# Patient Record
Sex: Male | Born: 1955 | Race: White | Hispanic: No | Marital: Married | Smoking: Current some day smoker
Health system: Southern US, Community
[De-identification: ages and names within clinical notes are randomized; demographics above are authoritative.]

## PROBLEM LIST (undated history)

## (undated) DIAGNOSIS — K219 Gastro-esophageal reflux disease without esophagitis: Secondary | ICD-10-CM

## (undated) DIAGNOSIS — Z789 Other specified health status: Secondary | ICD-10-CM

## (undated) DIAGNOSIS — Z8711 Personal history of peptic ulcer disease: Secondary | ICD-10-CM

## (undated) DIAGNOSIS — Z8719 Personal history of other diseases of the digestive system: Secondary | ICD-10-CM

## (undated) HISTORY — PX: COLONOSCOPY: SHX174

---

## 2015-12-16 DIAGNOSIS — J301 Allergic rhinitis due to pollen: Secondary | ICD-10-CM | POA: Insufficient documentation

## 2015-12-16 DIAGNOSIS — K219 Gastro-esophageal reflux disease without esophagitis: Secondary | ICD-10-CM | POA: Insufficient documentation

## 2016-01-01 DIAGNOSIS — R0602 Shortness of breath: Secondary | ICD-10-CM | POA: Insufficient documentation

## 2016-04-16 ENCOUNTER — Encounter: Payer: Self-pay | Admitting: Emergency Medicine

## 2016-04-16 DIAGNOSIS — K047 Periapical abscess without sinus: Secondary | ICD-10-CM | POA: Insufficient documentation

## 2016-04-16 DIAGNOSIS — F17200 Nicotine dependence, unspecified, uncomplicated: Secondary | ICD-10-CM | POA: Diagnosis not present

## 2016-04-16 DIAGNOSIS — R600 Localized edema: Secondary | ICD-10-CM | POA: Diagnosis present

## 2016-04-16 LAB — CBC WITH DIFFERENTIAL/PLATELET
BASOS ABS: 0.1 10*3/uL (ref 0–0.1)
Basophils Relative: 1 %
Eosinophils Absolute: 0.1 10*3/uL (ref 0–0.7)
Eosinophils Relative: 1 %
HEMATOCRIT: 34.8 % — AB (ref 40.0–52.0)
HEMOGLOBIN: 12.3 g/dL — AB (ref 13.0–18.0)
LYMPHS PCT: 15 %
Lymphs Abs: 1.7 10*3/uL (ref 1.0–3.6)
MCH: 32.8 pg (ref 26.0–34.0)
MCHC: 35.3 g/dL (ref 32.0–36.0)
MCV: 92.9 fL (ref 80.0–100.0)
MONO ABS: 1.4 10*3/uL — AB (ref 0.2–1.0)
Monocytes Relative: 13 %
NEUTROS ABS: 7.8 10*3/uL — AB (ref 1.4–6.5)
Neutrophils Relative %: 70 %
Platelets: 392 10*3/uL (ref 150–440)
RBC: 3.75 MIL/uL — AB (ref 4.40–5.90)
RDW: 13.8 % (ref 11.5–14.5)
WBC: 11.1 10*3/uL — ABNORMAL HIGH (ref 3.8–10.6)

## 2016-04-16 LAB — COMPREHENSIVE METABOLIC PANEL
ALK PHOS: 64 U/L (ref 38–126)
ALT: 26 U/L (ref 17–63)
AST: 24 U/L (ref 15–41)
Albumin: 3.8 g/dL (ref 3.5–5.0)
Anion gap: 7 (ref 5–15)
BILIRUBIN TOTAL: 0.7 mg/dL (ref 0.3–1.2)
BUN: 6 mg/dL (ref 6–20)
CALCIUM: 8.8 mg/dL — AB (ref 8.9–10.3)
CO2: 27 mmol/L (ref 22–32)
CREATININE: 0.9 mg/dL (ref 0.61–1.24)
Chloride: 97 mmol/L — ABNORMAL LOW (ref 101–111)
GFR calc Af Amer: >60 mL/min (ref >60)
Glucose, Bld: 114 mg/dL — ABNORMAL HIGH (ref 65–99)
Potassium: 3.8 mmol/L (ref 3.5–5.1)
Sodium: 131 mmol/L — ABNORMAL LOW (ref 135–145)
TOTAL PROTEIN: 6.9 g/dL (ref 6.5–8.1)

## 2016-04-16 NOTE — ED Triage Notes (Signed)
Pt with left sided upper jaw swelling extending to left eye for 3 days. Pt states has worsened this am. Pt denies known fever. Pt denies visual acuity changes. Pt with moderate amount of swelling noted to left cheek.

## 2016-04-16 NOTE — ED Triage Notes (Signed)
Pt states has been on zithromax for 24 hours for infection.

## 2016-04-17 ENCOUNTER — Emergency Department
Admission: EM | Admit: 2016-04-17 | Discharge: 2016-04-17 | Disposition: A | Discharge status: Home / Self Care | Payer: PRIVATE HEALTH INSURANCE | Attending: Emergency Medicine | Admitting: Emergency Medicine

## 2016-04-17 DIAGNOSIS — K047 Periapical abscess without sinus: Secondary | ICD-10-CM

## 2016-04-17 MED ORDER — HYDROMORPHONE HCL 1 MG/ML IJ SOLN
1.0000 mg | Freq: Once | INTRAMUSCULAR | Status: AC
  Administered 2016-04-17: 1 mg via INTRAVENOUS
  Filled 2016-04-17: qty 1

## 2016-04-17 MED ORDER — HYDROMORPHONE HCL 2 MG PO TABS: 2.0000 mg | ORAL_TABLET | Freq: Two times a day (BID) | ORAL | 0 refills | Status: DC | PRN

## 2016-04-17 MED ORDER — CLINDAMYCIN HCL 300 MG PO CAPS: 300.0000 mg | ORAL_CAPSULE | Freq: Three times a day (TID) | ORAL | 0 refills | Status: AC

## 2016-04-17 MED ORDER — CLINDAMYCIN PHOSPHATE 900 MG/50ML IV SOLN
900.0000 mg | Freq: Once | INTRAVENOUS | Status: AC
  Administered 2016-04-17: 900 mg via INTRAVENOUS
  Filled 2016-04-17: qty 50

## 2016-04-17 MED ORDER — ONDANSETRON HCL 4 MG/2ML IJ SOLN
4.0000 mg | Freq: Once | INTRAMUSCULAR | Status: AC
  Administered 2016-04-17: 4 mg via INTRAVENOUS
  Filled 2016-04-17: qty 2

## 2016-04-17 NOTE — ED Notes (Signed)
Pt states he has dental abscess he was tx for yesterday and was given tramadol for pain and a zpac for abx.  Pt comes into ED tonight in pain and states his pain is uncontrolled with the medication he was given.  He states he has taken his meds as prescribed.

## 2016-04-17 NOTE — ED Provider Notes (Signed)
Time Seen: Approximately 12:41 AM  I have reviewed the triage notes  Chief Complaint: Facial Swelling   History of Present Illness: Jacob Byrd is a 60 y.o. male *who presents after being evaluated by a dentist for what appears to be a dental infection. Patient's had some left-sided facial and jaw swelling that she swelled up to the left side of his face over the last 3 days. Patient states that he's had no fever at home and is been able to eat and drink though uncomfortable. The patient was placed on a Zithromax for a dental infection due to his allergy for penicillin. History reviewed. No pertinent past medical history.  There are no active problems to display for this patient.   History reviewed. No pertinent surgical history.  History reviewed. No pertinent surgical history.  Current Outpatient Rx  . Order #: [id number] Class: Print  . Order #: [id number] Class: Print    Allergies:  Penicillins  Family History: No family history on file.  Social History: Social History  Substance Use Topics  . Smoking status: Current Some Day Smoker  . Smokeless tobacco: Never Used  . Alcohol use Yes     Review of Systems:   10 point review of systems was performed and was otherwise negative:  Constitutional: No fever Eyes: No visual disturbances ENT: No sore throat, ear pain Cardiac: No chest pain Respiratory: No shortness of breath, wheezing, or stridor Abdomen: No abdominal pain, no vomiting, No diarrhea Endocrine: No weight loss, No night sweats Extremities: No peripheral edema, cyanosis Skin: No rashes, easy bruising Neurologic: No focal weakness, trouble with speech or swollowing Urologic: No dysuria, Hematuria, or urinary frequency   Physical Exam:  ED Triage Vitals  Enc Vitals Group     BP 04/16/16 2145 (!) 149/84     Pulse Rate 04/16/16 2145 98     Resp 04/16/16 2145 16     Temp 04/16/16 2145 99.2 F (37.3 C)     Temp Source 04/16/16 2145 Oral     SpO2  04/16/16 2145 100 %     Weight 04/16/16 2147 198 lb (89.8 kg)     Height 04/16/16 2147 5\' 10"  (1.778 m)     Head Circumference --      Peak Flow --      Pain Score 04/16/16 2148 6     Pain Loc --      Pain Edu? --      Excl. in GC? --     General: Awake , Alert , and Oriented times 3; GCS 15 Head: Patient has swelling in the left side of his face with tenderness there is no fluctuance. No trismus. Eyes: Pupils equal , round, reactive to light Nose/Throat: No nasal drainage, patent upper airway without erythema or exudate. Oral exam shows dental abscess left upper incisor region  Neck: Supple, Full range of motion, No anterior adenopathy or palpable thyroid masses no induration or swelling  Lungs: Clear to ascultation without wheezes , rhonchi, or rales Heart: Regular rate, regular rhythm without murmurs , gallops , or rubs.        Extremities: 2 plus symmetric pulses. No edema, clubbing or cyanosis Neurologic: normal ambulation, Motor symmetric without deficits, sensory intact Skin: warm, dry, no rashes   Labs:   All laboratory work was reviewed including any pertinent negatives or positives listed below:  Labs Reviewed  CBC WITH DIFFERENTIAL/PLATELET - Abnormal; Notable for the following:       Result Value   WBC  11.1 (*)    RBC 3.75 (*)    Hemoglobin 12.3 (*)    HCT 34.8 (*)    Neutro Abs 7.8 (*)    Monocytes Absolute 1.4 (*)    All other components within normal limits  COMPREHENSIVE METABOLIC PANEL - Abnormal; Notable for the following:    Sodium 131 (*)    Chloride 97 (*)    Glucose, Bld 114 (*)    Calcium 8.8 (*)    All other components within normal limits     ED Course:  I felt at this time imaging was not necessary. He appears to have a patent upper airway and full range of motion of his jaw. Patient obviously has some extension of the abscess in the left maxillary region. The patient was given IV clindamycin here in emergency department him be discharged on  oral clindamycin was wise to stop the Zithromax. He was advised to continue with The over-the-counter pain medication and was advised to return here if he develops trismus, fever, or any other new concerns Clinical Course     Assessment:  Dental abscess with facial swelling  Final Clinical Impression:   Final diagnoses:  Dental abscess     Plan:  Outpatient " New Prescriptions   CLINDAMYCIN (CLEOCIN) 300 MG CAPSULE    Take 1 capsule (300 mg total) by mouth 3 (three) times daily.   HYDROMORPHONE (DILAUDID) 2 MG TABLET    Take 1 tablet (2 mg total) by mouth every 12 (twelve) hours as needed for severe pain.  " Patient was advised to return immediately if condition worsens. Patient was advised to follow up with their primary care physician or other specialized physicians involved in their outpatient care. The patient and/or family member/power of attorney had laboratory results reviewed at the bedside. All questions and concerns were addressed and appropriate discharge instructions were distributed by the nursing staff.             Jennye MoccasinBrian S Harding Thomure, MD 04/17/16 [phone]

## 2016-04-17 NOTE — ED Notes (Signed)
Pt discharged to home.  Family member driving.  Discharge instructions reviewed.  Verbalized understanding.  No questions or concerns at this time.  Teach back verified.  Pt in NAD.  No items left in ED.   

## 2017-08-01 ENCOUNTER — Encounter: Payer: Self-pay | Admitting: *Deleted

## 2017-08-05 ENCOUNTER — Other Ambulatory Visit: Payer: Self-pay | Admitting: Family Medicine

## 2017-08-05 DIAGNOSIS — N5089 Other specified disorders of the male genital organs: Secondary | ICD-10-CM

## 2017-08-08 ENCOUNTER — Ambulatory Visit
Admission: RE | Admit: 2017-08-08 | Discharge: 2017-08-08 | Disposition: A | Discharge status: Home / Self Care | Payer: PRIVATE HEALTH INSURANCE | Source: Ambulatory Visit | Attending: Family Medicine | Admitting: Family Medicine

## 2017-08-08 DIAGNOSIS — N509 Disorder of male genital organs, unspecified: Secondary | ICD-10-CM | POA: Insufficient documentation

## 2017-08-08 DIAGNOSIS — N5089 Other specified disorders of the male genital organs: Secondary | ICD-10-CM

## 2017-08-08 DIAGNOSIS — I861 Scrotal varices: Secondary | ICD-10-CM | POA: Diagnosis not present

## 2017-08-08 DIAGNOSIS — N433 Hydrocele, unspecified: Secondary | ICD-10-CM | POA: Insufficient documentation

## 2017-08-08 DIAGNOSIS — N503 Cyst of epididymis: Secondary | ICD-10-CM | POA: Insufficient documentation

## 2017-08-11 ENCOUNTER — Other Ambulatory Visit: Payer: Self-pay

## 2017-08-11 ENCOUNTER — Ambulatory Visit
Admission: RE | Admit: 2017-08-11 | Discharge: 2017-08-11 | Disposition: A | Discharge status: Home / Self Care | Payer: PRIVATE HEALTH INSURANCE | Source: Ambulatory Visit | Attending: Ophthalmology | Admitting: Ophthalmology

## 2017-08-11 ENCOUNTER — Ambulatory Visit: Payer: PRIVATE HEALTH INSURANCE | Admitting: Anesthesiology

## 2017-08-11 ENCOUNTER — Encounter
Admission: RE | Disposition: A | Discharge status: Home / Self Care | Payer: Self-pay | Source: Ambulatory Visit | Attending: Ophthalmology

## 2017-08-11 ENCOUNTER — Encounter: Payer: Self-pay | Admitting: *Deleted

## 2017-08-11 DIAGNOSIS — H2512 Age-related nuclear cataract, left eye: Secondary | ICD-10-CM | POA: Diagnosis present

## 2017-08-11 DIAGNOSIS — Z79899 Other long term (current) drug therapy: Secondary | ICD-10-CM | POA: Insufficient documentation

## 2017-08-11 DIAGNOSIS — F17200 Nicotine dependence, unspecified, uncomplicated: Secondary | ICD-10-CM | POA: Diagnosis not present

## 2017-08-11 DIAGNOSIS — Z880 Allergy status to penicillin: Secondary | ICD-10-CM | POA: Diagnosis not present

## 2017-08-11 DIAGNOSIS — Z888 Allergy status to other drugs, medicaments and biological substances status: Secondary | ICD-10-CM | POA: Diagnosis not present

## 2017-08-11 DIAGNOSIS — K219 Gastro-esophageal reflux disease without esophagitis: Secondary | ICD-10-CM | POA: Insufficient documentation

## 2017-08-11 HISTORY — DX: Other specified health status: Z78.9

## 2017-08-11 HISTORY — PX: CATARACT EXTRACTION W/PHACO: SHX586

## 2017-08-11 HISTORY — DX: Personal history of other diseases of the digestive system: Z87.19

## 2017-08-11 HISTORY — DX: Personal history of peptic ulcer disease: Z87.11

## 2017-08-11 HISTORY — DX: Gastro-esophageal reflux disease without esophagitis: K21.9

## 2017-08-11 SURGERY — PHACOEMULSIFICATION, CATARACT, WITH IOL INSERTION
Anesthesia: Monitor Anesthesia Care | Site: Eye | Laterality: Left | Wound class: Clean

## 2017-08-11 MED ORDER — MOXIFLOXACIN HCL 0.5 % OP SOLN: 1.0000 [drp] | OPHTHALMIC | Status: DC | PRN

## 2017-08-11 MED ORDER — SODIUM HYALURONATE 23 MG/ML IO SOLN
INTRAOCULAR | Status: DC | PRN
  Administered 2017-08-11: 0.6 mL via INTRAOCULAR

## 2017-08-11 MED ORDER — BSS IO SOLN
INTRAOCULAR | Status: DC | PRN
  Administered 2017-08-11: 1 via INTRAOCULAR

## 2017-08-11 MED ORDER — ARMC OPHTHALMIC DILATING DROPS
1.0000 "application " | OPHTHALMIC | Status: AC
  Administered 2017-08-11 (×3): 1 via OPHTHALMIC

## 2017-08-11 MED ORDER — EPINEPHRINE PF 1 MG/ML IJ SOLN
INTRAMUSCULAR | Status: AC
  Filled 2017-08-11: qty 1

## 2017-08-11 MED ORDER — MOXIFLOXACIN HCL 0.5 % OP SOLN
OPHTHALMIC | Status: DC | PRN
  Administered 2017-08-11: 0.2 mL via OPHTHALMIC

## 2017-08-11 MED ORDER — MOXIFLOXACIN HCL 0.5 % OP SOLN
OPHTHALMIC | Status: AC
  Filled 2017-08-11: qty 3

## 2017-08-11 MED ORDER — SODIUM HYALURONATE 23 MG/ML IO SOLN
INTRAOCULAR | Status: AC
  Filled 2017-08-11: qty 0.6

## 2017-08-11 MED ORDER — FENTANYL CITRATE (PF) 100 MCG/2ML IJ SOLN
INTRAMUSCULAR | Status: AC
  Filled 2017-08-11: qty 2

## 2017-08-11 MED ORDER — FENTANYL CITRATE (PF) 100 MCG/2ML IJ SOLN
INTRAMUSCULAR | Status: DC | PRN
  Administered 2017-08-11: 25 ug via INTRAVENOUS
  Administered 2017-08-11: 75 ug via INTRAVENOUS

## 2017-08-11 MED ORDER — POVIDONE-IODINE 5 % OP SOLN
OPHTHALMIC | Status: AC
  Filled 2017-08-11: qty 30

## 2017-08-11 MED ORDER — SODIUM CHLORIDE 0.9 % IV SOLN
INTRAVENOUS | Status: DC
  Administered 2017-08-11: 50 mL/h via INTRAVENOUS

## 2017-08-11 MED ORDER — LIDOCAINE HCL (PF) 4 % IJ SOLN
INTRAMUSCULAR | Status: AC
  Filled 2017-08-11: qty 5

## 2017-08-11 MED ORDER — LIDOCAINE HCL (PF) 4 % IJ SOLN
INTRAOCULAR | Status: DC | PRN
  Administered 2017-08-11: 2 mL via OPHTHALMIC

## 2017-08-11 MED ORDER — SODIUM HYALURONATE 10 MG/ML IO SOLN
INTRAOCULAR | Status: DC | PRN
  Administered 2017-08-11: 0.55 mL via INTRAOCULAR

## 2017-08-11 MED ORDER — ARMC OPHTHALMIC DILATING DROPS
OPHTHALMIC | Status: AC
  Administered 2017-08-11: 1 via OPHTHALMIC
  Filled 2017-08-11: qty 0.4

## 2017-08-11 MED ORDER — POVIDONE-IODINE 5 % OP SOLN
OPHTHALMIC | Status: DC | PRN
  Administered 2017-08-11: 1 via OPHTHALMIC

## 2017-08-11 SURGICAL SUPPLY — 16 items
DISSECTOR HYDRO NUCLEUS 50X22 (MISCELLANEOUS) ×3 IMPLANT
GLOVE BIO SURGEON STRL SZ8 (GLOVE) ×3 IMPLANT
GLOVE BIOGEL M 6.5 STRL (GLOVE) ×3 IMPLANT
GLOVE SURG LX 7.5 STRW (GLOVE) ×2
GLOVE SURG LX STRL 7.5 STRW (GLOVE) ×1 IMPLANT
GOWN STRL REUS W/ TWL LRG LVL3 (GOWN DISPOSABLE) ×2 IMPLANT
GOWN STRL REUS W/TWL LRG LVL3 (GOWN DISPOSABLE) ×4
LABEL CATARACT MEDS ST (LABEL) ×3 IMPLANT
LENS IOL TECNIS ITEC 20.5 (Intraocular Lens) ×3 IMPLANT
PACK CATARACT (MISCELLANEOUS) ×3 IMPLANT
PACK CATARACT KING (MISCELLANEOUS) ×3 IMPLANT
PACK EYE AFTER SURG (MISCELLANEOUS) ×3 IMPLANT
SOL BSS BAG (MISCELLANEOUS) ×3
SOLUTION BSS BAG (MISCELLANEOUS) ×1 IMPLANT
WATER STERILE IRR 250ML POUR (IV SOLUTION) ×3 IMPLANT
WIPE NON LINTING 3.25X3.25 (MISCELLANEOUS) ×3 IMPLANT

## 2017-08-11 NOTE — Discharge Instructions (Signed)
Eye Surgery Discharge Instructions  Expect mild scratchy sensation or mild soreness. DO NOT RUB YOUR EYE!  The day of surgery:  Minimal physical activity, but bed rest is not required  No reading, computer work, or close hand work  No bending, lifting, or straining.  May watch TV  For 24 hours:  No driving, legal decisions, or alcoholic beverages  Safety precautions  Eat anything you prefer: It is better to start with liquids, then soup then solid foods.  _____ Eye patch should be worn until postoperative exam tomorrow.  ____ Solar shield eyeglasses should be worn for comfort in the sunlight/patch while sleeping  Resume all regular medications including aspirin or Coumadin if these were discontinued prior to surgery. You may shower, bathe, shave, or wash your hair. Tylenol may be taken for mild discomfort.  Call your doctor if you experience significant pain, nausea, or vomiting, fever > 101 or other signs of infection. [phone] or [phone] Specific instructions:  Follow-up Information    [place] CraneKing, Bradley Mark, MD Follow up on 08/12/2017.   Specialty:  Ophthalmology Why:  9:30 Contact information: [address] [place] [address] [phone]

## 2017-08-11 NOTE — Anesthesia Procedure Notes (Signed)
Procedure Name: MAC Performed by: Enrrique Mierzwa, CRNA Pre-anesthesia Checklist: Patient identified, Emergency Drugs available, Suction available, Patient being monitored and Timeout performed Oxygen Delivery Method: Nasal cannula       

## 2017-08-11 NOTE — Anesthesia Post-op Follow-up Note (Signed)
Anesthesia QCDR form completed.        

## 2017-08-11 NOTE — H&P (Signed)
The History and Physical notes are on paper, have been signed, and are to be scanned.   I have examined the patient and there are no changes to the H&P.   Jacob BladeBradley Byrd 08/11/2017 8:31 AM

## 2017-08-11 NOTE — Anesthesia Postprocedure Evaluation (Signed)
Anesthesia Post Note  Patient: Jacob Byrd  Procedure(s) Performed: CATARACT EXTRACTION PHACO AND INTRAOCULAR LENS PLACEMENT ([place]) (Left Eye)  Patient location during evaluation: PACU Anesthesia Type: MAC Level of consciousness: awake and alert and oriented Pain management: pain level controlled Vital Signs Assessment: post-procedure vital signs reviewed and stable Respiratory status: spontaneous breathing, nonlabored ventilation and respiratory function stable Cardiovascular status: blood pressure returned to baseline and stable Postop Assessment: no signs of nausea or vomiting Anesthetic complications: no     Last Vitals:  Vitals:   08/11/17 0718 08/11/17 0908  BP: 112/67 (!) 99/57  Pulse: 67   Resp: 17 12  Temp: 36.6 C 37.1 C  SpO2: 97% 100%    Last Pain:  Vitals:   08/11/17 0718  TempSrc: Tympanic                 Peggie Hornak

## 2017-08-11 NOTE — Transfer of Care (Signed)
Immediate Anesthesia Transfer of Care Note  Patient: Jacob Byrd  Procedure(s) Performed: CATARACT EXTRACTION PHACO AND INTRAOCULAR LENS PLACEMENT ([facility]) (Left Eye)  Patient Location: PACU  Anesthesia Type:MAC  Level of Consciousness: awake, alert  and oriented  Airway & Oxygen Therapy: Patient Spontanous Breathing  Post-op Assessment: Report given to RN and Post -op Vital signs reviewed and stable  Post vital signs: Reviewed and stable  Last Vitals:  Vitals:   08/11/17 0718 08/11/17 0908  BP: 112/67 (!) 99/57  Pulse: 67   Resp: 17 12  Temp: 36.6 C   SpO2: 97% 100%    Last Pain:  Vitals:   08/11/17 0718  TempSrc: Tympanic         Complications: No apparent anesthesia complications

## 2017-08-11 NOTE — Anesthesia Postprocedure Evaluation (Signed)
Anesthesia Post Note  Patient: Jacob Byrd  Procedure(s) Performed: CATARACT EXTRACTION PHACO AND INTRAOCULAR LENS PLACEMENT ([place]) (Left Eye)  Patient location during evaluation: PACU Anesthesia Type: MAC Level of consciousness: awake, awake and alert and oriented Pain management: pain level controlled Vital Signs Assessment: post-procedure vital signs reviewed and stable Respiratory status: spontaneous breathing, nonlabored ventilation and respiratory function stable Cardiovascular status: stable Anesthetic complications: no     Last Vitals:  Vitals:   08/11/17 0718 08/11/17 0908  BP: 112/67 (!) 99/57  Pulse: 67   Resp: 17 12  Temp: 36.6 C   SpO2: 97% 100%    Last Pain:  Vitals:   08/11/17 0718  TempSrc: Tympanic                 [name]

## 2017-08-11 NOTE — Op Note (Signed)
OPERATIVE NOTE  Jacob LentoJames Pultz [id number] 08/11/2017   PREOPERATIVE DIAGNOSIS:  Nuclear sclerotic cataract left eye.  H25.12   POSTOPERATIVE DIAGNOSIS:    Nuclear sclerotic cataract left eye.     PROCEDURE:  Phacoemusification with posterior chamber intraocular lens placement of the left eye   LENS:   Implant Name Type Inv. Item Serial No. Manufacturer Lot No. LRB No. Used  LENS IOL DIOP 20.5 - [id number] Intraocular Lens LENS IOL DIOP 20.5 [id number] AMO  Left 1       PCB00 +20.5   ULTRASOUND TIME: 0 minutes 31.4 seconds.  CDE 4.86   SURGEON:  Willey BladeBradley King, MD, MPH   ANESTHESIA:  Topical with tetracaine drops augmented with 1% preservative-free intracameral lidocaine.  ESTIMATED BLOOD LOSS: <1 mL   COMPLICATIONS:  None.   DESCRIPTION OF PROCEDURE:  The patient was identified in the holding room and transported to the operating room and placed in the supine position under the operating microscope.  The left eye was identified as the operative eye and it was prepped and draped in the usual sterile ophthalmic fashion.   A 1.0 millimeter clear-corneal paracentesis was made at the 5:00 position. 0.5 ml of preservative-free 1% lidocaine with epinephrine was injected into the anterior chamber.  The anterior chamber was filled with Healon 5 viscoelastic.  A 2.4 millimeter keratome was used to make a near-clear corneal incision at the 2:00 position.  A curvilinear capsulorrhexis was made with a cystotome and capsulorrhexis forceps.  Balanced salt solution was used to hydrodissect and hydrodelineate the nucleus.   Phacoemulsification was then used in stop and chop fashion to remove the lens nucleus and epinucleus.  The remaining cortex was then removed using the irrigation and aspiration handpiece. Healon was then placed into the capsular bag to distend it for lens placement.  A lens was then injected into the capsular bag.  The remaining viscoelastic was aspirated.   Wounds were hydrated  with balanced salt solution.  The anterior chamber was inflated to a physiologic pressure with balanced salt solution.  Intracameral vigamox 0.1 mL undiltued was injected into the eye and a drop placed onto the ocular surface.  No wound leaks were noted.  The patient was taken to the recovery room in stable condition without complications of anesthesia or surgery  Willey BladeBradley King 08/11/2017, 9:05 AM

## 2017-08-11 NOTE — Anesthesia Preprocedure Evaluation (Signed)
Anesthesia Evaluation  Patient identified by MRN, date of birth, ID band Patient awake    Reviewed: Allergy & Precautions, NPO status , Patient's Chart, lab work & pertinent test results  History of Anesthesia Complications Negative for: history of anesthetic complications  Airway Mallampati: II  TM Distance: >3 FB Neck ROM: Full    Dental  (+) Missing, Poor Dentition   Pulmonary neg sleep apnea, neg COPD, Current Smoker,    breath sounds clear to auscultation- rhonchi (-) wheezing      Cardiovascular Exercise Tolerance: Good (-) hypertension(-) CAD and (-) Past MI  Rhythm:Regular Rate:Normal - Systolic murmurs and - Diastolic murmurs    Neuro/Psych negative neurological ROS  negative psych ROS   GI/Hepatic Neg liver ROS, GERD  ,  Endo/Other  negative endocrine ROSneg diabetes  Renal/GU negative Renal ROS     Musculoskeletal negative musculoskeletal ROS (+)   Abdominal (+) - obese,   Peds  Hematology negative hematology ROS (+)   Anesthesia Other Findings Past Medical History: No date: GERD (gastroesophageal reflux disease) No date: History of stomach ulcers No date: Medical history non-contributory   Reproductive/Obstetrics                             Anesthesia Physical Anesthesia Plan  ASA: II  Anesthesia Plan: MAC   Post-op Pain Management:    Induction: Intravenous  PONV Risk Score and Plan: 0 and Midazolam  Airway Management Planned: Natural Airway  Additional Equipment:   Intra-op Plan:   Post-operative Plan:   Informed Consent: I have reviewed the patients History and Physical, chart, labs and discussed the procedure including the risks, benefits and alternatives for the proposed anesthesia with the patient or authorized representative who has indicated his/her understanding and acceptance.     Plan Discussed with: CRNA and Anesthesiologist  Anesthesia Plan  Comments:         Anesthesia Quick Evaluation

## 2017-08-12 ENCOUNTER — Encounter: Payer: Self-pay | Admitting: Ophthalmology

## 2017-08-24 ENCOUNTER — Ambulatory Visit: Payer: PRIVATE HEALTH INSURANCE | Admitting: Urology

## 2017-08-24 ENCOUNTER — Encounter: Payer: Self-pay | Admitting: Urology

## 2017-08-24 VITALS — BP 137/72 | HR 83 | Ht 70.0 in | Wt 186.0 lb

## 2017-08-24 DIAGNOSIS — N4342 Spermatocele of epididymis, multiple: Secondary | ICD-10-CM | POA: Diagnosis not present

## 2017-08-24 NOTE — Patient Instructions (Signed)
Spermatocele A spermatocele is a fluid-filled sac (cyst) inside the scrotum. This type of cyst often forms at the top of the testicle where sperm is stored (epididymis). The cyst sometimes forms along the tube that carries sperm away from the epididymis (vas deferens). Spermatoceles are usually painless. Most cysts are small, but they can grow larger. Spermatoceles are not cancerous (are benign). What are the causes? The cause of this condition is not known. What are the signs or symptoms? In most cases, small cysts do not cause symptoms. If you do have symptoms, they may include:  Dull pain.  A feeling of heaviness.  An enlargement of your scrotum, if the cyst is large.  How is this diagnosed? This condition is diagnosed based on a physical exam. You or your health care provider may notice the cyst when feeling your scrotum. Your provider may shine a light through (transilluminate) your scrotum to see if light will pass through the cyst. You may also have an ultrasound of your scrotum to rule out a tumor. How is this treated? Small spermatoceles do not need to be treated. If the spermatocele has grown large or is uncomfortable, surgery to remove the cyst may be recommended. Follow these instructions at home:  Watch your spermatocele for any changes.  Keep all follow-up visits as told by your health care provider. This is important. Contact a health care provider if:  Your spermatocele gets larger.  You have pain in your scrotum.  Your spermatocele comes back after treatment. This information is not intended to replace advice given to you by your health care provider. Make sure you discuss any questions you have with your health care provider. Document Released: 11/10/2015 Document Revised: 03/15/2016 Document Reviewed: 08/03/2015 Elsevier Interactive Patient Education  2018 Elsevier Inc.  

## 2017-08-24 NOTE — Progress Notes (Signed)
08/24/2017 8:48 AM   Jacob LentoJames Schuler [birth year] [id number]  Referring provider: Kandyce RudBabaoff, Marcus, MD [phone] [facility] [place] [facility] [place], [place] [address]  Chief Complaint  Patient presents with  . Groin Swelling    HPI: 62 year old male referred by his PCP for 3 weeks of scrotal swelling.  The patient reports that he first noticed an enlarged scrotum about 3 weeks ago.  Since then, it stayed stable in size.  He denies any trauma, pain, or any other significant symptoms.  Prior to this, he rarely examined or noticed his scrotum.  He does have very minimal discomfort at times with position but this is not particularly bothersome to him.  He denies any urinary difficulties.  Scrotal ultrasound ordered by PCP shows multiple bilateral epididymal cyst, left greater than right measuring up to 6 cm.   PMH: Past Medical History:  Diagnosis Date  . GERD (gastroesophageal reflux disease)   . History of stomach ulcers   . Medical history non-contributory     Surgical History: Past Surgical History:  Procedure Laterality Date  . CATARACT EXTRACTION W/PHACO Left 08/11/2017   Procedure: CATARACT EXTRACTION PHACO AND INTRAOCULAR LENS PLACEMENT ([facility]);  Surgeon: [place] CraneKing, Bradley Mark, MD;  Location: [facility] ORS;  Service: Ophthalmology;  Laterality: Left;  Lot #[id number] H US: 00:31.4 AP%: 15.5 CDE: 4.86  . COLONOSCOPY      Home Medications:  Allergies as of 08/24/2017      Reactions   Penicillins Other (See Comments)   Reaction happened as a child and he has no idea what happened   Seasonal Ic [cholestatin] Cough   Seasonal allergies with runny nose, etc      Medication List        Accurate as of 08/24/17  8:48 AM. Always use your most recent med list.          multivitamin with minerals Tabs tablet Take 1 tablet by mouth daily.   omeprazole 40 MG capsule Commonly known as:  PRILOSEC   vitamin C 1000 MG tablet Take 1,000 mg by mouth  daily.       Allergies:  Allergies  Allergen Reactions  . Penicillins Other (See Comments)    Reaction happened as a child and he has no idea what happened  . Seasonal Ic [Cholestatin] Cough    Seasonal allergies with runny nose, etc    Family History: No family history on file.  Social History:  reports that he has been smoking cigars.  he has never used smokeless tobacco. He reports that he does not drink alcohol or use drugs.  ROS: UROLOGY Frequent Urination?: No Hard to postpone urination?: No Burning/pain with urination?: No Get up at night to urinate?: Yes Leakage of urine?: No Urine stream starts and stops?: No Trouble starting stream?: No Do you have to strain to urinate?: No Blood in urine?: No Urinary tract infection?: No Sexually transmitted disease?: No Injury to kidneys or bladder?: No Painful intercourse?: No Weak stream?: No Erection problems?: No Penile pain?: No  Gastrointestinal Nausea?: No Vomiting?: No Indigestion/heartburn?: No Diarrhea?: No Constipation?: No  Constitutional Fever: No Night sweats?: No Weight loss?: No Fatigue?: No  Skin Skin rash/lesions?: No Itching?: No  Eyes Blurred vision?: No Double vision?: No  Ears/Nose/Throat Sore throat?: No Sinus problems?: No  Hematologic/Lymphatic Swollen glands?: No Easy bruising?: No  Cardiovascular Leg swelling?: No Chest pain?: No  Respiratory Cough?: No Shortness of breath?: No  Endocrine Excessive thirst?: No  Musculoskeletal Back  pain?: No Joint pain?: No  Neurological Headaches?: No Dizziness?: No  Psychologic Depression?: No Anxiety?: No  Physical Exam: BP 137/72   Pulse 83   Ht 5\' 10"  (1.778 m)   Wt 186 lb (84.4 kg)   BMI 26.69 kg/m   Constitutional:  Alert and oriented, No acute distress. HEENT: [place] AT, moist mucus membranes.  Trachea midline, no masses. Cardiovascular: No clubbing, cyanosis, or edema. Respiratory: Normal respiratory effort,  no increased work of breathing. GI: Abdomen is soft, nontender, nondistended, no abdominal masses GU: Circumcised phallus with orthotopic meatus.  Bilateral descended testicles.  Discrete from the testicles are bilateral epididymal cysts, multiple left greater than right measuring greater than 5 cm on the left.  Cord is palpable and normal.  No obvious inguinal hernia appreciated bilaterally. Skin: No rashes, bruises or suspicious lesions. Lymph: No inguinal adenopathy. Neurologic: Grossly intact, no focal deficits, moving all 4 extremities. Psychiatric: Normal mood and affect.  Laboratory Data: Cr 0.9 5/18  Urinalysis N/a  Pertinent Imaging: CLINICAL DATA:  Left scrotal mass.  EXAM: SCROTAL ULTRASOUND  DOPPLER ULTRASOUND OF THE TESTICLES  TECHNIQUE: Complete ultrasound examination of the testicles, epididymis, and other scrotal structures was performed. Color and spectral Doppler ultrasound were also utilized to evaluate blood flow to the testicles.  COMPARISON:  No prior.  FINDINGS: Right testicle  Measurements: 5.3 x 2.4 x 3.6 cm. No mass or microlithiasis visualized.  Left testicle  Measurements: 4.3 x 2.7 x 3.6 cm. No mass or microlithiasis visualized.  Right epididymis: Epididymal cysts, the largest measures 2.4 cm in maximum diameter.  Left epididymis: Epididymal cysts, the largest measures 6.5 cm in maximum diameter.  Hydrocele:  Trace bilateral hydroceles.  Varicocele:  Left varicocele.  Pulsed Doppler interrogation of both testes demonstrates normal low resistance arterial and venous waveforms bilaterally.  IMPRESSION: 1. Bilateral epididymal cysts. The largest on the right measures 2.4 cm in maximum diameter. The largest on the left measures 6.5 cm in maximum diameter.  2.  Left varicocele.  3. Trace bilateral hydroceles. No evidence of testicular mass or Torsion.  Scrotal ultrasound personally reviewed today.  Assessment &  Plan:    1. Spermatocele of epididymis, multiple Bilateral multiple epididymal cysts, left greater than right up to 6.5 cm Physical exam and scrotal ultrasound are consistent with above diagnosis We discussed that spermatoceles are nonpathologic and surgery could be considered if they become symptomatic Present time, he has very minimal bother  We did briefly discuss spermatocele ectomy including the procedure itself and recovery He is not interested in pursuing any procedure currently but will return as needed Information about spermatoceles provided today  F/u prn  Vanna Scotland, MD  [facility] [address], [address] [place], [place] [address] [phone]

## 2017-10-11 ENCOUNTER — Encounter: Payer: Self-pay | Admitting: *Deleted

## 2017-10-19 ENCOUNTER — Encounter: Payer: Self-pay | Admitting: Anesthesiology

## 2017-10-19 ENCOUNTER — Ambulatory Visit: Payer: PRIVATE HEALTH INSURANCE | Admitting: Anesthesiology

## 2017-10-19 ENCOUNTER — Encounter
Admission: RE | Disposition: A | Discharge status: Home / Self Care | Payer: Self-pay | Source: Ambulatory Visit | Attending: Ophthalmology

## 2017-10-19 ENCOUNTER — Ambulatory Visit
Admission: RE | Admit: 2017-10-19 | Discharge: 2017-10-19 | Disposition: A | Discharge status: Home / Self Care | Payer: PRIVATE HEALTH INSURANCE | Source: Ambulatory Visit | Attending: Ophthalmology | Admitting: Ophthalmology

## 2017-10-19 DIAGNOSIS — F17200 Nicotine dependence, unspecified, uncomplicated: Secondary | ICD-10-CM | POA: Insufficient documentation

## 2017-10-19 DIAGNOSIS — H2511 Age-related nuclear cataract, right eye: Secondary | ICD-10-CM | POA: Diagnosis present

## 2017-10-19 DIAGNOSIS — K219 Gastro-esophageal reflux disease without esophagitis: Secondary | ICD-10-CM | POA: Diagnosis not present

## 2017-10-19 DIAGNOSIS — Z880 Allergy status to penicillin: Secondary | ICD-10-CM | POA: Insufficient documentation

## 2017-10-19 DIAGNOSIS — Z888 Allergy status to other drugs, medicaments and biological substances status: Secondary | ICD-10-CM | POA: Insufficient documentation

## 2017-10-19 DIAGNOSIS — Z79899 Other long term (current) drug therapy: Secondary | ICD-10-CM | POA: Insufficient documentation

## 2017-10-19 HISTORY — PX: CATARACT EXTRACTION W/PHACO: SHX586

## 2017-10-19 SURGERY — PHACOEMULSIFICATION, CATARACT, WITH IOL INSERTION
Anesthesia: Monitor Anesthesia Care | Site: Eye | Laterality: Right | Wound class: Clean

## 2017-10-19 MED ORDER — PROPOFOL 10 MG/ML IV BOLUS
INTRAVENOUS | Status: AC
  Filled 2017-10-19: qty 20

## 2017-10-19 MED ORDER — MIDAZOLAM HCL 2 MG/2ML IJ SOLN
INTRAMUSCULAR | Status: DC | PRN
  Administered 2017-10-19: 2 mg via INTRAVENOUS

## 2017-10-19 MED ORDER — EPINEPHRINE PF 1 MG/ML IJ SOLN
INTRAOCULAR | Status: DC | PRN
  Administered 2017-10-19: 10:00:00 via OPHTHALMIC

## 2017-10-19 MED ORDER — LIDOCAINE HCL (PF) 4 % IJ SOLN
INTRAMUSCULAR | Status: AC
  Filled 2017-10-19: qty 5

## 2017-10-19 MED ORDER — SODIUM HYALURONATE 10 MG/ML IO SOLN
INTRAOCULAR | Status: DC | PRN
  Administered 2017-10-19: 0.55 mL via INTRAOCULAR

## 2017-10-19 MED ORDER — POVIDONE-IODINE 5 % OP SOLN
OPHTHALMIC | Status: DC | PRN
  Administered 2017-10-19: 1 via OPHTHALMIC

## 2017-10-19 MED ORDER — SODIUM HYALURONATE 23 MG/ML IO SOLN
INTRAOCULAR | Status: DC | PRN
  Administered 2017-10-19: 0.6 mL via INTRAOCULAR

## 2017-10-19 MED ORDER — ONDANSETRON HCL 4 MG/2ML IJ SOLN
INTRAMUSCULAR | Status: AC
  Filled 2017-10-19: qty 2

## 2017-10-19 MED ORDER — FENTANYL CITRATE (PF) 100 MCG/2ML IJ SOLN
INTRAMUSCULAR | Status: AC
  Filled 2017-10-19: qty 2

## 2017-10-19 MED ORDER — MIDAZOLAM HCL 2 MG/2ML IJ SOLN
INTRAMUSCULAR | Status: AC
  Filled 2017-10-19: qty 2

## 2017-10-19 MED ORDER — GLYCOPYRROLATE 0.2 MG/ML IJ SOLN
INTRAMUSCULAR | Status: DC | PRN
  Administered 2017-10-19: 0.1 mg via INTRAVENOUS

## 2017-10-19 MED ORDER — POVIDONE-IODINE 5 % OP SOLN
OPHTHALMIC | Status: AC
  Filled 2017-10-19: qty 30

## 2017-10-19 MED ORDER — SODIUM HYALURONATE 23 MG/ML IO SOLN
INTRAOCULAR | Status: AC
  Filled 2017-10-19: qty 0.6

## 2017-10-19 MED ORDER — FENTANYL CITRATE (PF) 100 MCG/2ML IJ SOLN
INTRAMUSCULAR | Status: DC | PRN
  Administered 2017-10-19 (×2): 50 ug via INTRAVENOUS

## 2017-10-19 MED ORDER — GLYCOPYRROLATE 0.2 MG/ML IJ SOLN
INTRAMUSCULAR | Status: AC
  Filled 2017-10-19: qty 1

## 2017-10-19 MED ORDER — ARMC OPHTHALMIC DILATING DROPS
OPHTHALMIC | Status: AC
  Administered 2017-10-19: 1 via OPHTHALMIC
  Filled 2017-10-19: qty 0.4

## 2017-10-19 MED ORDER — MOXIFLOXACIN HCL 0.5 % OP SOLN
OPHTHALMIC | Status: AC
  Filled 2017-10-19: qty 3

## 2017-10-19 MED ORDER — CARBACHOL 0.01 % IO SOLN
INTRAOCULAR | Status: DC | PRN
  Administered 2017-10-19: 0.5 mL via INTRAOCULAR

## 2017-10-19 MED ORDER — ONDANSETRON HCL 4 MG/2ML IJ SOLN
INTRAMUSCULAR | Status: DC | PRN
  Administered 2017-10-19: 4 mg via INTRAVENOUS

## 2017-10-19 MED ORDER — SODIUM CHLORIDE 0.9 % IV SOLN
INTRAVENOUS | Status: DC
  Administered 2017-10-19: 08:00:00 via INTRAVENOUS

## 2017-10-19 MED ORDER — LIDOCAINE HCL (PF) 4 % IJ SOLN
INTRAOCULAR | Status: DC | PRN
  Administered 2017-10-19: 4 mL via OPHTHALMIC

## 2017-10-19 MED ORDER — EPINEPHRINE PF 1 MG/ML IJ SOLN
INTRAMUSCULAR | Status: AC
  Filled 2017-10-19: qty 2

## 2017-10-19 MED ORDER — MOXIFLOXACIN HCL 0.5 % OP SOLN
OPHTHALMIC | Status: DC | PRN
  Administered 2017-10-19: 0.2 mL via OPHTHALMIC

## 2017-10-19 MED ORDER — PROPOFOL 10 MG/ML IV BOLUS
INTRAVENOUS | Status: DC | PRN
  Administered 2017-10-19: 20 mg via INTRAVENOUS
  Administered 2017-10-19: 30 mg via INTRAVENOUS

## 2017-10-19 MED ORDER — ARMC OPHTHALMIC DILATING DROPS
1.0000 "application " | OPHTHALMIC | Status: AC
  Administered 2017-10-19 (×3): 1 via OPHTHALMIC

## 2017-10-19 MED ORDER — MOXIFLOXACIN HCL 0.5 % OP SOLN: 1.0000 [drp] | OPHTHALMIC | Status: DC | PRN

## 2017-10-19 SURGICAL SUPPLY — 16 items
DISSECTOR HYDRO NUCLEUS 50X22 (MISCELLANEOUS) ×2 IMPLANT
GLOVE BIO SURGEON STRL SZ8 (GLOVE) ×2 IMPLANT
GLOVE BIOGEL M 6.5 STRL (GLOVE) ×2 IMPLANT
GLOVE SURG LX 7.5 STRW (GLOVE) ×1
GLOVE SURG LX STRL 7.5 STRW (GLOVE) ×1 IMPLANT
GOWN STRL REUS W/ TWL LRG LVL3 (GOWN DISPOSABLE) ×2 IMPLANT
GOWN STRL REUS W/TWL LRG LVL3 (GOWN DISPOSABLE) ×2
LABEL CATARACT MEDS ST (LABEL) ×2 IMPLANT
LENS IOL TECNIS ITEC 22.0 (Intraocular Lens) ×2 IMPLANT
PACK CATARACT (MISCELLANEOUS) ×2 IMPLANT
PACK CATARACT KING (MISCELLANEOUS) ×2 IMPLANT
PACK EYE AFTER SURG (MISCELLANEOUS) ×2 IMPLANT
SOL BSS BAG (MISCELLANEOUS) ×2
SOLUTION BSS BAG (MISCELLANEOUS) ×1 IMPLANT
WATER STERILE IRR 250ML POUR (IV SOLUTION) ×2 IMPLANT
WIPE NON LINTING 3.25X3.25 (MISCELLANEOUS) ×2 IMPLANT

## 2017-10-19 NOTE — Anesthesia Preprocedure Evaluation (Signed)
Anesthesia Evaluation  Patient identified by MRN, date of birth, ID band Patient awake    Reviewed: Allergy & Precautions, NPO status , Patient's Chart, lab work & pertinent test results  History of Anesthesia Complications Negative for: history of anesthetic complications  Airway Mallampati: II  TM Distance: >3 FB Neck ROM: Full    Dental  (+) Missing, Poor Dentition   Pulmonary neg sleep apnea, neg COPD, Current Smoker,    breath sounds clear to auscultation- rhonchi (-) wheezing      Cardiovascular Exercise Tolerance: Good (-) hypertension(-) CAD and (-) Past MI  Rhythm:Regular Rate:Normal - Systolic murmurs and - Diastolic murmurs    Neuro/Psych negative neurological ROS  negative psych ROS   GI/Hepatic Neg liver ROS, GERD  ,  Endo/Other  negative endocrine ROSneg diabetes  Renal/GU negative Renal ROS     Musculoskeletal negative musculoskeletal ROS (+)   Abdominal (+) - obese,   Peds  Hematology negative hematology ROS (+)   Anesthesia Other Findings Past Medical History: No date: GERD (gastroesophageal reflux disease) No date: History of stomach ulcers No date: Medical history non-contributory   Reproductive/Obstetrics                             Anesthesia Physical  Anesthesia Plan  ASA: II  Anesthesia Plan: MAC   Post-op Pain Management:    Induction: Intravenous  PONV Risk Score and Plan: 0 and Midazolam  Airway Management Planned: Natural Airway  Additional Equipment:   Intra-op Plan:   Post-operative Plan:   Informed Consent: I have reviewed the patients History and Physical, chart, labs and discussed the procedure including the risks, benefits and alternatives for the proposed anesthesia with the patient or authorized representative who has indicated his/her understanding and acceptance.     Plan Discussed with: CRNA and Anesthesiologist  Anesthesia Plan  Comments:         Anesthesia Quick Evaluation

## 2017-10-19 NOTE — H&P (Signed)
The History and Physical notes are on paper, have been signed, and are to be scanned.   I have examined the patient and there are no changes to the H&P.   Willey BladeBradley King 10/19/2017 9:23 AM

## 2017-10-19 NOTE — Anesthesia Postprocedure Evaluation (Signed)
Anesthesia Post Note  Patient: Devyon Keator  Procedure(s) Performed: CATARACT EXTRACTION PHACO AND INTRAOCULAR LENS PLACEMENT (IOC) (Right Eye)  Patient location during evaluation: PACU Anesthesia Type: MAC Level of consciousness: awake and alert Pain management: pain level controlled Vital Signs Assessment: post-procedure vital signs reviewed and stable Respiratory status: spontaneous breathing, nonlabored ventilation, respiratory function stable and patient connected to nasal cannula oxygen Cardiovascular status: stable and blood pressure returned to baseline Postop Assessment: no apparent nausea or vomiting Anesthetic complications: no     Last Vitals:  Vitals:   10/19/17 1012 10/19/17 1024  BP: (!) 93/54 97/63  Pulse: 67 70  Resp: 14 16  Temp: (!) 36.4 C 36.6 C  SpO2: 93% 97%    Last Pain:  Vitals:   10/19/17 1012  TempSrc: Temporal                 Martha Clan

## 2017-10-19 NOTE — Transfer of Care (Signed)
Immediate Anesthesia Transfer of Care Note  Patient: Jacob Byrd  Procedure(s) Performed: CATARACT EXTRACTION PHACO AND INTRAOCULAR LENS PLACEMENT (IOC) (Right Eye)  Patient Location: PACU and Short Stay  Anesthesia Type:MAC  Level of Consciousness: awake, alert , oriented and patient cooperative  Airway & Oxygen Therapy: Patient Spontanous Breathing  Post-op Assessment: Report given to RN, Post -op Vital signs reviewed and stable and Patient moving all extremities X 4  Post vital signs: Reviewed and stable  Last Vitals:  Vitals:   10/19/17 1010 10/19/17 1012  BP: (!) 93/54 (!) 93/54  Pulse: 78 67  Resp: 16 14  Temp: 36.7 C (!) 36.4 C  SpO2: 97% 93%    Last Pain:  Vitals:   10/19/17 1012  TempSrc: Temporal         Complications: No apparent anesthesia complications

## 2017-10-19 NOTE — Op Note (Signed)
OPERATIVE NOTE  Jacob LentoJames Byrd [id number] 10/19/2017   PREOPERATIVE DIAGNOSIS:  Nuclear sclerotic cataract right eye.  H25.11   POSTOPERATIVE DIAGNOSIS:    Nuclear sclerotic cataract right eye.     PROCEDURE:  Phacoemusification with posterior chamber intraocular lens placement of the right eye   LENS:   Implant Name Type Inv. Item Serial No. Manufacturer Lot No. LRB No. Used  LENS IOL DIOP 22.0 - [id number] Intraocular Lens LENS IOL DIOP 22.0 [id number]  Right 1       PCB00 +22.0   ULTRASOUND TIME: 0 minutes 23.8 seconds.  CDE 2.18   SURGEON:  Willey BladeBradley Esthefany Herrig, MD, MPH  ANESTHESIOLOGIST: Anesthesiologist: Lenard SimmerKarenz, Andrew, MD CRNA: Sherol DadeMacMang, Josephine H, CRNA   ANESTHESIA:  Topical with tetracaine drops augmented with 1% preservative-free intracameral lidocaine.  ESTIMATED BLOOD LOSS: less than 1 mL.   COMPLICATIONS:  None.   DESCRIPTION OF PROCEDURE:  The patient was identified in the holding room and transported to the operating room and placed in the supine position under the operating microscope.  The right eye was identified as the operative eye and it was prepped and draped in the usual sterile ophthalmic fashion.   A 1.0 millimeter clear-corneal paracentesis was made at the 10:30 position. 0.5 ml of preservative-free 1% lidocaine with epinephrine was injected into the anterior chamber.  The anterior chamber was filled with Healon 5 viscoelastic.  A 2.4 millimeter keratome was used to make a near-clear corneal incision at the 8:00 position.  A curvilinear capsulorrhexis was made with a cystotome and capsulorrhexis forceps.  Balanced salt solution was used to hydrodissect and hydrodelineate the nucleus.   Phacoemulsification was then used in stop and chop fashion to remove the lens nucleus and epinucleus.  The remaining cortex was then removed using the irrigation and aspiration handpiece. Healon was then placed into the capsular bag to distend it for lens placement.  A lens  was then injected into the capsular bag.  The remaining viscoelastic was aspirated.   Wounds were hydrated with balanced salt solution.  The anterior chamber was inflated to a physiologic pressure with balanced salt solution.   Intracameral vigamox 0.1 mL undiluted was injected into the eye and a drop placed onto the ocular surface.  No wound leaks were noted.  The patient was taken to the recovery room in stable condition without complications of anesthesia or surgery  Willey BladeBradley Daylynn Stumpp 10/19/2017, 10:09 AM

## 2017-10-19 NOTE — Anesthesia Post-op Follow-up Note (Signed)
Anesthesia QCDR form completed.        

## 2017-10-19 NOTE — Discharge Instructions (Signed)
Eye Surgery Discharge Instructions ° ° ° °Expect mild scratchy sensation or mild soreness. °DO NOT RUB YOUR EYE! ° °The day of surgery: °Minimal physical activity, but bed rest is not required °No reading, computer work, or close hand work °No bending, lifting, or straining. °May watch TV ° °For 24 hours: °No driving, legal decisions, or alcoholic beverages °Safety precautions °Eat anything you prefer: It is better to start with liquids, then soup then solid foods. °_____ Eye patch should be worn until postoperative exam tomorrow. °____ Solar shield eyeglasses should be worn for comfort in the sunlight/patch while sleeping ° °Resume all regular medications including aspirin or Coumadin if these were discontinued prior to surgery. °You may shower, bathe, shave, or wash your hair. °Tylenol may be taken for mild discomfort. ° °Call your doctor if you experience significant pain, nausea, or vomiting, fever > 101 or other signs of infection. [phone] or [phone] °Specific instructions: ° ° Follow-up Information   ° ° King, Bradley Mark, MD Follow up.   °Specialty: Ophthalmology °Contact information: °[address] °[place] [address] [phone] ° ° °  °  ° °  °  ° °  °  °

## 2019-03-28 ENCOUNTER — Encounter: Payer: Self-pay | Admitting: Urology

## 2019-03-28 ENCOUNTER — Ambulatory Visit: Payer: PRIVATE HEALTH INSURANCE | Admitting: Urology

## 2019-03-28 ENCOUNTER — Other Ambulatory Visit: Payer: Self-pay

## 2019-03-28 VITALS — BP 159/81 | HR 88 | Ht 70.0 in | Wt 200.0 lb

## 2019-03-28 DIAGNOSIS — N4340 Spermatocele of epididymis, unspecified: Secondary | ICD-10-CM | POA: Diagnosis not present

## 2019-03-28 LAB — URINALYSIS, COMPLETE
Bilirubin, UA: NEGATIVE
Glucose, UA: NEGATIVE
Ketones, UA: NEGATIVE
Leukocytes,UA: NEGATIVE
Nitrite, UA: NEGATIVE
Protein,UA: NEGATIVE
RBC, UA: NEGATIVE
Specific Gravity, UA: 1.02 (ref 1.005–1.030)
Urobilinogen, Ur: 0.2 mg/dL (ref 0.2–1.0)
pH, UA: 7 (ref 5.0–7.5)

## 2019-03-28 LAB — MICROSCOPIC EXAMINATION
Bacteria, UA: NONE SEEN
Epithelial Cells (non renal): NONE SEEN /hpf (ref 0–10)
RBC: NONE SEEN /hpf (ref 0–2)
WBC, UA: NONE SEEN /hpf (ref 0–5)

## 2019-03-28 NOTE — H&P (View-Only) (Signed)
 03/28/2019 9:08 AM   Jacob Byrd 05/13/1956 [id number]  Referring provider: Babaoff, Marcus, MD [address] [facility] [place] [facility] [place],  [place] [address]  Chief Complaint  Patient presents with  . Groin Swelling    HPI: 63-year-old male with known bilateral epididymal cyst who returns today complaining of an enlarging scrotum.  He reports since he was evaluated in 08/2017, the left-sided portion of the scrotum has continued to enlarge to the point where it is quite uncomfortable.  This is much larger than the size of his testicle at this point.  He has to reposition with sitting and sleeping.  He has a heaviness sensation.  It is exacerbated with activity.  He is now interested in pursuing surgical intervention.  He did have a scrotal ultrasound ordered by his PCP back in January 2019 which showed bilateral epididymal cysts, largest on the left at that time measuring 6.5 cm and 2.4 cm on the right.  He denies any urinary symptoms.  No testicular trauma.   PMH: Past Medical History:  Diagnosis Date  . GERD (gastroesophageal reflux disease)   . History of stomach ulcers   . Medical history non-contributory     Surgical History: Past Surgical History:  Procedure Laterality Date  . CATARACT EXTRACTION W/PHACO Left 08/11/2017   Procedure: CATARACT EXTRACTION PHACO AND INTRAOCULAR LENS PLACEMENT (IOC);  Surgeon: King, Bradley Mark, MD;  Location: [facility] ORS;  Service: Ophthalmology;  Laterality: Left;  Lot #[id number] US: 00:31.4 AP%: 15.5 CDE: 4.86  . CATARACT EXTRACTION W/PHACO Right 10/19/2017   Procedure: CATARACT EXTRACTION PHACO AND INTRAOCULAR LENS PLACEMENT (IOC);  Surgeon: King, Bradley Mark, MD;  Location: [facility] ORS;  Service: Ophthalmology;  Laterality: Right;  US 00:23.8 AP% 9.2 CDE 2.18 Fluid Pack Lot # [id number]  . COLONOSCOPY      Home Medications:  Allergies as of 03/28/2019      Reactions   Penicillins Other (See Comments)   Reaction happened as a child and he has no idea what happened   Seasonal Ic [cholestatin] Other (See Comments), Cough   Seasonal allergies with runny nose, etc      Medication List       Accurate as of March 28, 2019  9:08 AM. If you have any questions, ask your nurse or doctor.        cetirizine 10 MG tablet Commonly known as: ZYRTEC Take 10 mg by mouth daily.   multivitamin with minerals Tabs tablet Take 1 tablet by mouth daily.   omeprazole 40 MG capsule Commonly known as: PRILOSEC Take 40 mg by mouth daily.   vitamin C 1000 MG tablet Take 1,000 mg by mouth daily.       Allergies:  Allergies  Allergen Reactions  . Penicillins Other (See Comments)    Reaction happened as a child and he has no idea what happened  . Seasonal Ic [Cholestatin] Other (See Comments) and Cough    Seasonal allergies with runny nose, etc    Family History: No family history on file.  Social History:  reports that he has been smoking cigars. He has never used smokeless tobacco. He reports that he does not drink alcohol or use drugs.  ROS: UROLOGY Frequent Urination?: No Hard to postpone urination?: No Burning/pain with urination?: No Get up at night to urinate?: Yes Leakage of urine?: No Urine stream starts and stops?: No Trouble starting stream?: No Do you have to strain to urinate?: No Blood in urine?: No   Urinary tract infection?: No Sexually transmitted disease?: No Injury to kidneys or bladder?: No Painful intercourse?: No Weak stream?: No Erection problems?: No Penile pain?: No  Gastrointestinal Nausea?: No Vomiting?: No Indigestion/heartburn?: No Diarrhea?: No Constipation?: No  Constitutional Fever: No Night sweats?: No Weight loss?: No Fatigue?: No  Skin Skin rash/lesions?: No Itching?: No  Eyes Blurred vision?: No Double vision?: No  Ears/Nose/Throat Sore throat?: No Sinus problems?: No  Hematologic/Lymphatic Swollen glands?: No Easy  bruising?: No  Cardiovascular Leg swelling?: No Chest pain?: No  Respiratory Cough?: No Shortness of breath?: No  Endocrine Excessive thirst?: No  Musculoskeletal Back pain?: No Joint pain?: No  Neurological Headaches?: No Dizziness?: No  Psychologic Depression?: No Anxiety?: No  Physical Exam: BP (!) 159/81   Pulse 88   Ht 5\' 10"  (1.778 m)   Wt 200 lb (90.7 kg)   BMI 28.70 kg/m   Constitutional:  Alert and oriented, No acute distress. HEENT: [place] AT, moist mucus membranes.  Trachea midline, no masses. Cardiovascular: No clubbing, cyanosis, or edema. Respiratory: Normal respiratory effort, no increased work of breathing. GI: Abdomen is soft, nontender, nondistended, no abdominal masses GU: Normal circumcised phallus.  Left multiloculated epididymal cyst appreciated on left side, apple-sized in conglomeration.  He has a grape sized epididymal cyst on the right.  Testicles palpable bilaterally, normal. Skin: No rashes, bruises or suspicious lesions. Neurologic: Grossly intact, no focal deficits, moving all 4 extremities. Psychiatric: Normal mood and affect.  Laboratory Data: Lab Results  Component Value Date   WBC 11.1 (H) 04/16/2016   HGB 12.3 (L) 04/16/2016   HCT 34.8 (L) 04/16/2016   MCV 92.9 04/16/2016   PLT 392 04/16/2016    Lab Results  Component Value Date   CREATININE 0.90 04/16/2016   Results for orders placed or performed in visit on 03/28/19  Microscopic Examination   URINE  Result Value Ref Range   WBC, UA None seen 0 - 5 /hpf   RBC None seen 0 - 2 /hpf   Epithelial Cells (non renal) None seen 0 - 10 /hpf   Bacteria, UA None seen None seen/Few  Urinalysis, Complete  Result Value Ref Range   Specific Gravity, UA 1.020 1.005 - 1.030   pH, UA 7.0 5.0 - 7.5   Color, UA Yellow Yellow   Appearance Ur Clear Clear   Leukocytes,UA Negative Negative   Protein,UA Negative Negative/Trace   Glucose, UA Negative Negative   Ketones, UA Negative  Negative   RBC, UA Negative Negative   Bilirubin, UA Negative Negative   Urobilinogen, Ur 0.2 0.2 - 1.0 mg/dL   Nitrite, UA Negative Negative   Microscopic Examination See below:     Pertinent Imaging: Previous scrotal ultrasound from 2019 was reviewed again today.  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  Given that he has multiple cysts bilaterally, I recommended consideration of epididymectomy rather than just spermatocele ectomy given the concern for recurrence.  Risks and benefits of each technique was discussed.  He would like to proceed with a bilateral procedure in the form of epididymectomy is bilaterally.  Risk of surgeries were discussed in detail including risk of bleeding, infection, damage surrounding structures, chronic testicular pain, testicular loss amongst others.  All questions were answered. - Urinalysis, Complete  Hollice Espy, MD  [facility] [address]  [place], [place] [address] [phone]

## 2019-03-28 NOTE — Patient Instructions (Signed)
Spermatocele  A spermatocele is a fluid-filled sac (cyst) inside the scrotum. This type of cyst often forms at the top of the testicle where sperm is stored (epididymis). The cyst sometimes forms along the tube that carries sperm away from the epididymis (vas deferens). Spermatoceles are usually painless. Most cysts are small, but they can grow larger. Spermatoceles are not cancerous (are benign). What are the causes? The cause of this condition is not known. What are the signs or symptoms? In most cases, small cysts do not cause symptoms. If you do have symptoms, they may include:  Dull pain.  A feeling of heaviness.  An enlargement of your scrotum, if the cyst is large. How is this diagnosed? This condition is diagnosed based on a physical exam. You or your health care provider may notice the cyst when feeling your scrotum. Your provider may shine a light through (transilluminate) your scrotum to see if light will pass through the cyst. You may also have an ultrasound of your scrotum to rule out a tumor. How is this treated? Small spermatoceles do not need to be treated. If the spermatocele has grown large or is uncomfortable, surgery to remove the cyst may be recommended. Follow these instructions at home:  Watch your spermatocele for any changes.  Keep all follow-up visits as told by your health care provider. This is important. Contact a health care provider if:  Your spermatocele gets larger.  You have pain in your scrotum.  Your spermatocele comes back after treatment. This information is not intended to replace advice given to you by your health care provider. Make sure you discuss any questions you have with your health care provider. Document Released: 11/10/2015 Document Revised: 07/01/2017 Document Reviewed: 08/03/2015 Elsevier Patient Education  2020 [employer].

## 2019-03-28 NOTE — Progress Notes (Signed)
03/28/2019 9:08 AM   Jacob LentoJames Route 1956/02/12 [id number]  Referring provider: Kandyce RudBabaoff, Marcus, MD [phone] [facility] [place] [facility] [place],  [place] [id number]  Chief Complaint  Patient presents with  . Groin Swelling    HPI: 63 year old male with known bilateral epididymal cyst who returns today complaining of an enlarging scrotum.  He reports since he was evaluated in 08/2017, the left-sided portion of the scrotum has continued to enlarge to the point where it is quite uncomfortable.  This is much larger than the size of his testicle at this point.  He has to reposition with sitting and sleeping.  He has a heaviness sensation.  It is exacerbated with activity.  He is now interested in pursuing surgical intervention.  He did have a scrotal ultrasound ordered by his PCP back in January 2019 which showed bilateral epididymal cysts, largest on the left at that time measuring 6.5 cm and 2.4 cm on the right.  He denies any urinary symptoms.  No testicular trauma.   PMH: Past Medical History:  Diagnosis Date  . GERD (gastroesophageal reflux disease)   . History of stomach ulcers   . Medical history non-contributory     Surgical History: Past Surgical History:  Procedure Laterality Date  . CATARACT EXTRACTION W/PHACO Left 08/11/2017   Procedure: CATARACT EXTRACTION PHACO AND INTRAOCULAR LENS PLACEMENT (IOC);  Surgeon: [place] CraneKing, Bradley Mark, MD;  Location: [facility] ORS;  Service: Ophthalmology;  Laterality: Left;  Lot #[id number] US: 00:31.4 AP%: 15.5 CDE: 4.86  . CATARACT EXTRACTION W/PHACO Right 10/19/2017   Procedure: CATARACT EXTRACTION PHACO AND INTRAOCULAR LENS PLACEMENT (IOC);  Surgeon: [place] CraneKing, Bradley Mark, MD;  Location: [facility] ORS;  Service: Ophthalmology;  Laterality: Right;  US 00:23.8 AP% 9.2 CDE 2.18 Fluid Pack Lot # [id number]  . COLONOSCOPY      Home Medications:  Allergies as of 03/28/2019      Reactions   Penicillins Other (See Comments)   Reaction happened as a child and he has no idea what happened   Seasonal Ic [cholestatin] Other (See Comments), Cough   Seasonal allergies with runny nose, etc      Medication List       Accurate as of March 28, 2019  9:08 AM. If you have any questions, ask your nurse or doctor.        cetirizine 10 MG tablet Commonly known as: ZYRTEC Take 10 mg by mouth daily.   multivitamin with minerals Tabs tablet Take 1 tablet by mouth daily.   omeprazole 40 MG capsule Commonly known as: PRILOSEC Take 40 mg by mouth daily.   vitamin C 1000 MG tablet Take 1,000 mg by mouth daily.       Allergies:  Allergies  Allergen Reactions  . Penicillins Other (See Comments)    Reaction happened as a child and he has no idea what happened  . Seasonal Ic [Cholestatin] Other (See Comments) and Cough    Seasonal allergies with runny nose, etc    Family History: No family history on file.  Social History:  reports that he has been smoking cigars. He has never used smokeless tobacco. He reports that he does not drink alcohol or use drugs.  ROS: UROLOGY Frequent Urination?: No Hard to postpone urination?: No Burning/pain with urination?: No Get up at night to urinate?: Yes Leakage of urine?: No Urine stream starts and stops?: No Trouble starting stream?: No Do you have to strain to urinate?: No Blood in urine?: No  Urinary tract infection?: No Sexually transmitted disease?: No Injury to kidneys or bladder?: No Painful intercourse?: No Weak stream?: No Erection problems?: No Penile pain?: No  Gastrointestinal Nausea?: No Vomiting?: No Indigestion/heartburn?: No Diarrhea?: No Constipation?: No  Constitutional Fever: No Night sweats?: No Weight loss?: No Fatigue?: No  Skin Skin rash/lesions?: No Itching?: No  Eyes Blurred vision?: No Double vision?: No  Ears/Nose/Throat Sore throat?: No Sinus problems?: No  Hematologic/Lymphatic Swollen glands?: No Easy  bruising?: No  Cardiovascular Leg swelling?: No Chest pain?: No  Respiratory Cough?: No Shortness of breath?: No  Endocrine Excessive thirst?: No  Musculoskeletal Back pain?: No Joint pain?: No  Neurological Headaches?: No Dizziness?: No  Psychologic Depression?: No Anxiety?: No  Physical Exam: BP (!) 159/81   Pulse 88   Ht 5\' 10"  (1.778 m)   Wt 200 lb (90.7 kg)   BMI 28.70 kg/m   Constitutional:  Alert and oriented, No acute distress. HEENT: [place] AT, moist mucus membranes.  Trachea midline, no masses. Cardiovascular: No clubbing, cyanosis, or edema. Respiratory: Normal respiratory effort, no increased work of breathing. GI: Abdomen is soft, nontender, nondistended, no abdominal masses GU: Normal circumcised phallus.  Left multiloculated epididymal cyst appreciated on left side, apple-sized in conglomeration.  He has a grape sized epididymal cyst on the right.  Testicles palpable bilaterally, normal. Skin: No rashes, bruises or suspicious lesions. Neurologic: Grossly intact, no focal deficits, moving all 4 extremities. Psychiatric: Normal mood and affect.  Laboratory Data: Lab Results  Component Value Date   WBC 11.1 (H) 04/16/2016   HGB 12.3 (L) 04/16/2016   HCT 34.8 (L) 04/16/2016   MCV 92.9 04/16/2016   PLT 392 04/16/2016    Lab Results  Component Value Date   CREATININE 0.90 04/16/2016   Results for orders placed or performed in visit on 03/28/19  Microscopic Examination   URINE  Result Value Ref Range   WBC, UA None seen 0 - 5 /hpf   RBC None seen 0 - 2 /hpf   Epithelial Cells (non renal) None seen 0 - 10 /hpf   Bacteria, UA None seen None seen/Few  Urinalysis, Complete  Result Value Ref Range   Specific Gravity, UA 1.020 1.005 - 1.030   pH, UA 7.0 5.0 - 7.5   Color, UA Yellow Yellow   Appearance Ur Clear Clear   Leukocytes,UA Negative Negative   Protein,UA Negative Negative/Trace   Glucose, UA Negative Negative   Ketones, UA Negative  Negative   RBC, UA Negative Negative   Bilirubin, UA Negative Negative   Urobilinogen, Ur 0.2 0.2 - 1.0 mg/dL   Nitrite, UA Negative Negative   Microscopic Examination See below:     Pertinent Imaging: Previous scrotal ultrasound from 2019 was reviewed again today.  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  Given that he has multiple cysts bilaterally, I recommended consideration of epididymectomy rather than just spermatocele ectomy given the concern for recurrence.  Risks and benefits of each technique was discussed.  He would like to proceed with a bilateral procedure in the form of epididymectomy is bilaterally.  Risk of surgeries were discussed in detail including risk of bleeding, infection, damage surrounding structures, chronic testicular pain, testicular loss amongst others.  All questions were answered. - Urinalysis, Complete  Hollice Espy, MD  [facility] [address]  [place], [place] [address] [phone]

## 2019-03-30 ENCOUNTER — Telehealth: Payer: Self-pay | Admitting: Radiology

## 2019-03-30 ENCOUNTER — Other Ambulatory Visit: Payer: Self-pay | Admitting: Radiology

## 2019-03-30 DIAGNOSIS — N4342 Spermatocele of epididymis, multiple: Secondary | ICD-10-CM

## 2019-03-30 NOTE — Telephone Encounter (Signed)
Discussed the [facility] Information form below over the phone with patient.    [place], [address], [place] [address] Telephone: [phone] Fax: [phone]   Thank you for choosing [facility] for your upcoming surgery!  We are always here to assist in your urological needs.  Please read the following information with specific details for your upcoming appointments related to your surgery. Please contact Liah Morr at [phone] Option 3 with any questions.  The Name of Your Surgery: Bilateral epididymectomy Your Surgery Date: 04/23/2019 Your Surgeon: Hollice Espy  Please call Same Day Surgery at [phone] between the hours of 1pm-3pm one day prior to your surgery. They will inform you of the time to arrive at Same Day Surgery which is located on the second floor of the [facility].   Please refer to the attached letter regarding instructions for Pre-Admission Testing. You will receive a call from the [place] office regarding your appointment with them.  The Pre-Admission Testing office is located at [place], on the first floor of the [facility] in [place] (office is to the right as you enter through the [employer] of the [facility]). Please have all medications you are currently taking and your insurance card available.  A COVID-19 test will be required prior to surgery and once test is performed you will need to remain in quarantine until the day of surgery. Patient was advised to have nothing to eat or drink after midnight the night prior to surgery except that he may have only water until 2 hours before surgery with nothing to drink within 2 hours of surgery.  The patient currently takes no blood thinners. Patient's questions were answered and he expressed understanding of these  instructions.

## 2019-04-16 ENCOUNTER — Other Ambulatory Visit: Payer: Self-pay

## 2019-04-16 ENCOUNTER — Encounter
Admission: RE | Admit: 2019-04-16 | Discharge: 2019-04-16 | Disposition: A | Discharge status: Home / Self Care | Payer: PRIVATE HEALTH INSURANCE | Source: Ambulatory Visit | Attending: Urology | Admitting: Urology

## 2019-04-16 DIAGNOSIS — Z01818 Encounter for other preprocedural examination: Secondary | ICD-10-CM | POA: Insufficient documentation

## 2019-04-16 NOTE — Patient Instructions (Signed)
Your procedure is scheduled on: Monday 04/23/19 Report to [facility]. To find out your arrival time please call [phone] between 1PM - 3PM on Friday 04/20/19.  Remember: Instructions that are not followed completely may result in serious medical risk, up to and including death, or upon the discretion of your surgeon and anesthesiologist your surgery may need to be rescheduled.     _X__ 1. Do not eat food after midnight the night before your procedure.                 No gum chewing or hard candies. You may drink clear liquids up to 2 hours                 before you are scheduled to arrive for your surgery- DO not drink clear                 liquids within 2 hours of the start of your surgery.                 Clear Liquids include:  water, apple juice without pulp, clear carbohydrate                 drink such as Clearfast or Gatorade, Black Coffee or Tea (Do not add                 anything to coffee or tea). Diabetics water only  __X__2.  On the morning of surgery brush your teeth with toothpaste and water, you                 may rinse your mouth with mouthwash if you wish.  Do not swallow any              toothpaste of mouthwash.     _X__ 3.  No Alcohol for 24 hours before or after surgery.   _X__ 4.  Do Not Smoke or use e-cigarettes For 24 Hours Prior to Your Surgery.                 Do not use any chewable tobacco products for at least 6 hours prior to                 surgery.  ____  5.  Bring all medications with you on the day of surgery if instructed.   __X__  6.  Notify your doctor if there is any change in your medical condition      (cold, fever, infections).     Do not wear jewelry, make-up, hairpins, clips or nail polish. Do not wear lotions, powders, or perfumes.  Do not shave 48 hours prior to surgery. Men may shave face and neck. Do not bring valuables to the hospital.    [facility] is not responsible for any  belongings or valuables.  Contacts, dentures/partials or body piercings may not be worn into surgery. Bring a case for your contacts, glasses or hearing aids, a denture cup will be supplied. Leave your suitcase in the car. After surgery it may be brought to your room. For patients admitted to the hospital, discharge time is determined by your treatment team.   Patients discharged the day of surgery will not be allowed to drive home.   Please read over the following fact sheets that you were given:   MRSA Information  __X__ Take these medicines the morning of surgery with A SIP OF WATER:  1. cetirizine (ZYRTEC)  2. omeprazole (PRILOSEC  3.   4.  5.  6.  ____ Fleet Enema (as directed)   ____ Use CHG Soap/SAGE wipes as directed  ____ Use inhalers on the day of surgery  ____ Stop metformin/Janumet/Farxiga 2 days prior to surgery    ____ Take 1/2 of usual insulin dose the night before surgery. No insulin the morning          of surgery.   ____ Stop Blood Thinners Coumadin/Plavix/Xarelto/Pleta/Pradaxa/Eliquis/Effient/Aspirin  on   Or contact your Surgeon, Cardiologist or Medical Doctor regarding  ability to stop your blood thinners  __X__ Stop Anti-inflammatories 7 days before surgery such as Advil, Ibuprofen, Motrin,  BC or Goodies Powder, Naprosyn, Naproxen, Aleve, Aspirin    __X__ Stop all herbal supplements, fish oil or vitamin E until after surgery.    ____ Bring C-Pap to the hospital.

## 2019-04-19 ENCOUNTER — Other Ambulatory Visit
Admission: RE | Admit: 2019-04-19 | Discharge: 2019-04-19 | Disposition: A | Discharge status: Home / Self Care | Payer: PRIVATE HEALTH INSURANCE | Source: Ambulatory Visit | Attending: Urology | Admitting: Urology

## 2019-04-19 ENCOUNTER — Other Ambulatory Visit: Payer: Self-pay

## 2019-04-19 DIAGNOSIS — Z01812 Encounter for preprocedural laboratory examination: Secondary | ICD-10-CM | POA: Insufficient documentation

## 2019-04-19 DIAGNOSIS — Z20828 Contact with and (suspected) exposure to other viral communicable diseases: Secondary | ICD-10-CM | POA: Insufficient documentation

## 2019-04-19 LAB — SARS CORONAVIRUS 2 (TAT 6-24 HRS): SARS Coronavirus 2: NEGATIVE

## 2019-04-22 MED ORDER — CLINDAMYCIN PHOSPHATE 900 MG/50ML IV SOLN
900.0000 mg | INTRAVENOUS | Status: AC
  Administered 2019-04-23: 900 mg via INTRAVENOUS

## 2019-04-23 ENCOUNTER — Ambulatory Visit: Payer: PRIVATE HEALTH INSURANCE | Admitting: Anesthesiology

## 2019-04-23 ENCOUNTER — Other Ambulatory Visit: Payer: Self-pay

## 2019-04-23 ENCOUNTER — Ambulatory Visit
Admission: RE | Admit: 2019-04-23 | Discharge: 2019-04-23 | Disposition: A | Discharge status: Home / Self Care | Payer: PRIVATE HEALTH INSURANCE | Attending: Urology | Admitting: Urology

## 2019-04-23 ENCOUNTER — Encounter: Payer: Self-pay | Admitting: Anesthesiology

## 2019-04-23 ENCOUNTER — Encounter
Admission: RE | Disposition: A | Discharge status: Home / Self Care | Payer: Self-pay | Source: Home / Self Care | Attending: Urology

## 2019-04-23 DIAGNOSIS — K219 Gastro-esophageal reflux disease without esophagitis: Secondary | ICD-10-CM | POA: Diagnosis not present

## 2019-04-23 DIAGNOSIS — N4342 Spermatocele of epididymis, multiple: Secondary | ICD-10-CM | POA: Insufficient documentation

## 2019-04-23 DIAGNOSIS — F17290 Nicotine dependence, other tobacco product, uncomplicated: Secondary | ICD-10-CM | POA: Diagnosis not present

## 2019-04-23 HISTORY — PX: EPIDIDYMECTOMY: SHX6275

## 2019-04-23 SURGERY — EPIDIDYMECTOMY
Anesthesia: General | Laterality: Bilateral

## 2019-04-23 MED ORDER — MIDAZOLAM HCL 2 MG/2ML IJ SOLN
INTRAMUSCULAR | Status: AC
  Filled 2019-04-23: qty 2

## 2019-04-23 MED ORDER — DEXAMETHASONE SODIUM PHOSPHATE 10 MG/ML IJ SOLN
INTRAMUSCULAR | Status: DC | PRN
  Administered 2019-04-23: 10 mg via INTRAVENOUS

## 2019-04-23 MED ORDER — CLINDAMYCIN PHOSPHATE 900 MG/50ML IV SOLN
INTRAVENOUS | Status: AC
  Filled 2019-04-23: qty 50

## 2019-04-23 MED ORDER — EPHEDRINE SULFATE 50 MG/ML IJ SOLN
INTRAMUSCULAR | Status: DC | PRN
  Administered 2019-04-23: 10 mg via INTRAVENOUS

## 2019-04-23 MED ORDER — PHENYLEPHRINE HCL (PRESSORS) 10 MG/ML IV SOLN
INTRAVENOUS | Status: DC | PRN
  Administered 2019-04-23 (×4): 100 ug via INTRAVENOUS

## 2019-04-23 MED ORDER — BUPIVACAINE HCL (PF) 0.5 % IJ SOLN
INTRAMUSCULAR | Status: AC
  Filled 2019-04-23: qty 30

## 2019-04-23 MED ORDER — DOCUSATE SODIUM 100 MG PO CAPS: 100.0000 mg | ORAL_CAPSULE | Freq: Two times a day (BID) | ORAL | 0 refills | Status: AC

## 2019-04-23 MED ORDER — OXYCODONE HCL 5 MG/5ML PO SOLN: 5.0000 mg | Freq: Once | ORAL | Status: DC | PRN

## 2019-04-23 MED ORDER — FENTANYL CITRATE (PF) 100 MCG/2ML IJ SOLN: 25.0000 ug | INTRAMUSCULAR | Status: DC | PRN

## 2019-04-23 MED ORDER — LIDOCAINE HCL (CARDIAC) PF 100 MG/5ML IV SOSY
PREFILLED_SYRINGE | INTRAVENOUS | Status: DC | PRN
  Administered 2019-04-23: 100 mg via INTRAVENOUS

## 2019-04-23 MED ORDER — BUPIVACAINE HCL 0.5 % IJ SOLN
INTRAMUSCULAR | Status: DC | PRN
  Administered 2019-04-23: 10 mL

## 2019-04-23 MED ORDER — PROPOFOL 10 MG/ML IV BOLUS
INTRAVENOUS | Status: DC | PRN
  Administered 2019-04-23: 120 mg via INTRAVENOUS

## 2019-04-23 MED ORDER — MIDAZOLAM HCL 2 MG/2ML IJ SOLN
INTRAMUSCULAR | Status: DC | PRN
  Administered 2019-04-23: 2 mg via INTRAVENOUS

## 2019-04-23 MED ORDER — OXYCODONE HCL 5 MG PO TABS: 5.0000 mg | ORAL_TABLET | Freq: Once | ORAL | Status: DC | PRN

## 2019-04-23 MED ORDER — HYDROCODONE-ACETAMINOPHEN 5-325 MG PO TABS: 1.0000 | ORAL_TABLET | Freq: Four times a day (QID) | ORAL | 0 refills | Status: AC | PRN

## 2019-04-23 MED ORDER — FENTANYL CITRATE (PF) 100 MCG/2ML IJ SOLN
INTRAMUSCULAR | Status: AC
  Filled 2019-04-23: qty 2

## 2019-04-23 MED ORDER — LACTATED RINGERS IV SOLN
INTRAVENOUS | Status: DC
  Administered 2019-04-23: 11:00:00 via INTRAVENOUS

## 2019-04-23 MED ORDER — ACETAMINOPHEN 10 MG/ML IV SOLN
INTRAVENOUS | Status: AC
  Filled 2019-04-23: qty 100

## 2019-04-23 MED ORDER — FENTANYL CITRATE (PF) 100 MCG/2ML IJ SOLN
INTRAMUSCULAR | Status: DC | PRN
  Administered 2019-04-23: 50 ug via INTRAVENOUS

## 2019-04-23 MED ORDER — PROPOFOL 10 MG/ML IV BOLUS
INTRAVENOUS | Status: AC
  Filled 2019-04-23: qty 20

## 2019-04-23 MED ORDER — ONDANSETRON HCL 4 MG/2ML IJ SOLN
INTRAMUSCULAR | Status: DC | PRN
  Administered 2019-04-23: 4 mg via INTRAVENOUS

## 2019-04-23 SURGICAL SUPPLY — 31 items
BLADE SURG 15 STRL LF DISP TIS (BLADE) ×1 IMPLANT
BLADE SURG 15 STRL SS (BLADE) ×2
CANISTER SUCT 1200ML W/VALVE (MISCELLANEOUS) ×3 IMPLANT
CHLORAPREP W/TINT 26 (MISCELLANEOUS) ×3 IMPLANT
COVER WAND RF STERILE (DRAPES) ×3 IMPLANT
DERMABOND ADVANCED (GAUZE/BANDAGES/DRESSINGS) ×2
DERMABOND ADVANCED .7 DNX12 (GAUZE/BANDAGES/DRESSINGS) ×1 IMPLANT
DRAIN PENROSE 1/4X12 LTX STRL (WOUND CARE) ×3 IMPLANT
DRAPE LAPAROTOMY 77X122 PED (DRAPES) ×3 IMPLANT
DRSG GAUZE FLUFF 36X18 (GAUZE/BANDAGES/DRESSINGS) ×3 IMPLANT
GAUZE 4X4 16PLY RFD (DISPOSABLE) ×2 IMPLANT
GLOVE BIO SURGEON STRL SZ 6.5 (GLOVE) ×2 IMPLANT
GLOVE BIO SURGEONS STRL SZ 6.5 (GLOVE) ×1
GLOVE BIOGEL M 6.5 STRL (GLOVE) ×3 IMPLANT
GOWN STRL REUS W/ TWL LRG LVL3 (GOWN DISPOSABLE) ×2 IMPLANT
GOWN STRL REUS W/TWL LRG LVL3 (GOWN DISPOSABLE) ×4
KIT TURNOVER KIT A (KITS) ×3 IMPLANT
NDL HYPO 25GX1X1/2 BEV (NEEDLE) ×1 IMPLANT
NEEDLE HYPO 25GX1X1/2 BEV (NEEDLE) ×3 IMPLANT
NS IRRIG 500ML POUR BTL (IV SOLUTION) ×3 IMPLANT
PACK BASIN MINOR ARMC (MISCELLANEOUS) ×3 IMPLANT
SPONGE KITTNER 5P (MISCELLANEOUS) IMPLANT
SUPPORT SCROTAL LG STRP (MISCELLANEOUS) ×2 IMPLANT
SUPPORTER ATHLETIC LG (MISCELLANEOUS) ×1
SUT CHROMIC 3 0 PS 2 (SUTURE) ×2 IMPLANT
SUT MNCRL AB 4-0 PS2 18 (SUTURE) ×6 IMPLANT
SUT VIC AB 3-0 SH 27 (SUTURE) ×4
SUT VIC AB 3-0 SH 27X BRD (SUTURE) ×2 IMPLANT
SUT VICRYL+ 3-0 144IN (SUTURE) ×3 IMPLANT
SYR 10ML LL (SYRINGE) ×3 IMPLANT
SYR BULB IRRIG 60ML STRL (SYRINGE) ×3 IMPLANT

## 2019-04-23 NOTE — Anesthesia Post-op Follow-up Note (Signed)
Anesthesia QCDR form completed.        

## 2019-04-23 NOTE — Op Note (Signed)
Date of procedure: 04/23/19  Preoperative diagnosis:  1. Bilateral spermatocele  Postoperative diagnosis:  1. Bilateral spermatocele  Procedure: 1. Bilateral partial epididymectomy/spermatocelectomy  Surgeon: Hollice Espy, MD  Anesthesia: General  Complications: None  Intraoperative findings: Large left multiloculated spermatocele measuring approximately 10 cm on the left with distortion/attenuation of the left epididymis.  Smaller right spermatocele approximately 2.5 cm.  EBL: 25 cc  Specimens: Left spermatocele with head of left epididymis.  Right head and body of epididymis.  Drains: None  Indication: Jacob Byrd is a 63 y.o. patient with enlarging bilateral multiple spermatoceles.  After reviewing the management options for treatment, he elected to proceed with the above surgical procedure(s). We have discussed the potential benefits and risks of the procedure, side effects of the proposed treatment, the likelihood of the patient achieving the goals of the procedure, and any potential problems that might occur during the procedure or recuperation. Informed consent has been obtained.  Description of procedure:  The patient was taken to the operating room and general anesthesia was induced.  The patient was placed in the supine position, prepped and draped in the usual sterile fashion, and preoperative antibiotics were administered. A preoperative time-out was performed.   Approximately 4 cm long midline scrotal incision was created along the right median raphae.  The subcutaneous tissues were opened using Bovie electrocautery.  The left hemiscrotum was opened including tunica vaginalis and the left testicle and large spermatocele was then delivered.  Notably, the spermatocele was multiloculated measuring greater than 10 cm.  The left epididymis was somewhat attenuated in the cord and cord structures were relatively adherent to the spermatocele.  Care was taken to ensure the  correct orientation and to identify each structure.  This point time, he began extremely carefully excising the spermatocele along with head of the left epididymis from the testicle itself.  Ultimately I was able to peel the cord structures off of the posterior aspect of the spermatocele and excised the entire spermatocele with the head of the epididymis en bloc.  Several areas were tied off using 3-0 Vicryl's for hemostasis.  I like to the sleeve the body and tail due to the distortion of the anatomy and attenuation of the epididymis in order not to compromise the blood flow to the left testicle.  Notably, with manipulation, the testicle did develop slightly dusky appearance but there was a palpable pulse at the base of the testicle near the insertion of the testicular artery.  Finally, this testicle was returned back into the left hemiscrotum.  The right hemiscrotum was then entered by opening the subcutaneous tissues using Bovie electrocautery through the septum.  The right testicle tunica vaginalis was opened and the testicle and epididymis along with a smaller spermatocele was identified.  This measured approximately 2.5 cm.  The epididymis on the side was much more clearly defined with his anatomic structures.  I then proceeded with excision of the right head and body of the epididymis.  Again I elected to leave the tail of the epididymis and ligate the structure using a 3-0 Vicryl suture two thirds the way down the body leaving the tail in order to avoid any compromise of the blood flow to the testicle.  Upon dissecting, I did get into the spermatocele but this was excised en bloc with the epididymis itself.  Hemostasis was achieved using 3-0 Vicryl sutures as deemed necessary.  This is passed off the field as the right head and body of epididymis.  Vascular supply to  this testicle was completely intact and not at all suspicious.  It was irrigated returned back into the right hemiscrotum.  Attention was  then turned back to the left testicle.  The duskiness had improved and it appeared that some of the blood flow could be restored, possibly secondary to arterial spasm previously.  This testis was also irrigated return back in the left hemiscrotum.    Finally, the wound was closed using a 3-0 Vicryl suture to close dartos in a running fashion.  The scrotal skin was closed using 4-0 chromic suture in interrupted fashion.  The wound was cleaned and dried.  Marcaine was instilled into the subcutaneous tissues and skin.  After this dried, Dermabond was applied.  The patient was dressed using scrotal fluffs and a scrotal support device.  Plan: Wound check in 4 weeks.  Jacob Byrd, M.D.

## 2019-04-23 NOTE — Anesthesia Preprocedure Evaluation (Signed)
Anesthesia Evaluation  Patient identified by MRN, date of birth, ID band Patient awake    Reviewed: Allergy & Precautions, H&P , NPO status , Patient's Chart, lab work & pertinent test results  History of Anesthesia Complications Negative for: history of anesthetic complications  Airway Mallampati: II  TM Distance: >3 FB Neck ROM: full    Dental  (+) Chipped, Poor Dentition, Missing, Caps   Pulmonary neg shortness of breath, Current Smoker and Patient abstained from smoking.,           Cardiovascular Exercise Tolerance: Good (-) angina(-) Past MI and (-) DOE negative cardio ROS       Neuro/Psych negative neurological ROS  negative psych ROS   GI/Hepatic Neg liver ROS, GERD  Medicated and Controlled,  Endo/Other  negative endocrine ROS  Renal/GU      Musculoskeletal   Abdominal   Peds  Hematology negative hematology ROS (+)   Anesthesia Other Findings Past Medical History: No date: GERD (gastroesophageal reflux disease) No date: History of stomach ulcers No date: Medical history non-contributory  Past Surgical History: 08/11/2017: CATARACT EXTRACTION W/PHACO; Left     Comment:  Procedure: CATARACT EXTRACTION PHACO AND INTRAOCULAR               LENS PLACEMENT (IOC);  Surgeon: Eulogio Bear, MD;                Location: [facility] ORS;  Service: Ophthalmology;  Laterality:              Left;  Lot #[id number] H [place]: 00:31.4 AP%: 15.5 CDE: 4.86 10/19/2017: CATARACT EXTRACTION W/PHACO; Right     Comment:  Procedure: CATARACT EXTRACTION PHACO AND INTRAOCULAR               LENS PLACEMENT (IOC);  Surgeon: Eulogio Bear, MD;                Location: [facility] ORS;  Service: Ophthalmology;  Laterality:              Right;  [place] 00:23.8 AP% 9.2 CDE 2.18 Fluid Pack Lot #               [id number] No date: COLONOSCOPY     Reproductive/Obstetrics negative OB ROS                              Anesthesia Physical Anesthesia Plan  ASA: II  Anesthesia Plan: General LMA   Post-op Pain Management:    Induction: Intravenous  PONV Risk Score and Plan: Dexamethasone, Ondansetron, Midazolam and Treatment may vary due to age or medical condition  Airway Management Planned: LMA  Additional Equipment:   Intra-op Plan:   Post-operative Plan: Extubation in OR  Informed Consent: I have reviewed the patients History and Physical, chart, labs and discussed the procedure including the risks, benefits and alternatives for the proposed anesthesia with the patient or authorized representative who has indicated his/her understanding and acceptance.     Dental Advisory Given  Plan Discussed with: Anesthesiologist, CRNA and Surgeon  Anesthesia Plan Comments: (Patient consented for risks of anesthesia including but not limited to:  - adverse reactions to medications - damage to teeth, lips or other oral mucosa - sore throat or hoarseness - Damage to heart, brain, lungs or loss of life  Patient voiced understanding.)        Anesthesia Quick Evaluation

## 2019-04-23 NOTE — Transfer of Care (Signed)
Immediate Anesthesia Transfer of Care Note  Patient: Jacob Byrd  Procedure(s) Performed: EPIDIDYMECTOMY (Bilateral )  Patient Location: PACU  Anesthesia Type:General  Level of Consciousness: sedated  Airway & Oxygen Therapy: Patient Spontanous Breathing and Patient connected to face mask oxygen  Post-op Assessment: Report given to RN and Post -op Vital signs reviewed and stable  Post vital signs: Reviewed and stable  Last Vitals:  Vitals Value Taken Time  BP    Temp    Pulse 77 04/23/19 1449  Resp 13 04/23/19 1449  SpO2 99 % 04/23/19 1449  Vitals shown include unvalidated device data.  Last Pain:  Vitals:   04/23/19 1056  TempSrc: Temporal  PainSc: 0-No pain         Complications: No apparent anesthesia complications

## 2019-04-23 NOTE — Discharge Instructions (Signed)
Hydrocelectomy/ Spermatocele/ Epididmectomy, Adult, Care After This sheet gives you information about how to care for yourself after your procedure. Your health care provider may also give you more specific instructions. If you have problems or questions, contact your health care provider. What can I expect after the procedure? After your procedure, it is common to have mild discomfort, swelling, and bruising in the pouch that holds your testicles (scrotum). Follow these instructions at home: Bathing  Ask your health care provider when you can shower, take baths, or go swimming.  If you were told to wear an athletic support strap, take it off when you shower or take a bath. Incision care   Follow instructions from your health care provider about how to take care of your incision. Make sure you: ? Wash your hands with soap and water before you change your bandage (dressing). If soap and water are not available, use hand sanitizer. ? Change your dressing as told by your health care provider. ? Leave stitches (sutures) in place.  Check your incision and scrotum every day for signs of infection. Check for: ? More redness, swelling, or pain. ? Blood or fluid. ? Warmth. ? Pus or a bad smell. Managing pain, stiffness, and swelling  If directed, apply ice to the injured area: ? Put ice in a plastic bag. ? Place a towel between your skin and the bag. ? Leave the ice on for 20 minutes, 2-3 times per day. Driving  Do not drive for 24 hours if you were given a sedative.  Do not drive or use heavy machinery while taking prescription pain medicine.  Ask your health care provider when it is safe to drive. Activity  Do not do any activities that require great strength and energy (are vigorous) for as long as told by your health care provider.  Return to your normal activities as told by your health care provider. Ask your health care provider what activities are safe for you.  Do not lift  anything that is heavier than 10 lb (4.5 kg) until your health care provider says that it is safe. General instructions  Take over-the-counter and prescription medicines only as told by your health care provider.  Keep all follow-up visits as told by your health care provider. This is important.  If you were given an athletic support strap, wear it as told by your health care provider.  If you had a drain put in during the procedure, you will need to return for a follow-up visit to have it removed. Contact a health care provider if:  Your pain is not controlled with medicine.  You have more redness or swelling around your scrotum.  You have blood or fluid coming from your scrotum.  Your incision feels warm to the touch.  You have pus or a bad smell coming from your scrotum.  You have a fever. This information is not intended to replace advice given to you by your health care provider. Make sure you discuss any questions you have with your health care provider. Document Released: 04/09/2015 Document Revised: 07/01/2017 Document Reviewed: 04/17/2016 Elsevier Patient Education  2020 [employer]  AMBULATORY SURGERY  DISCHARGE INSTRUCTIONS   1) The drugs that you were given will stay in your system until tomorrow so for the next 24 hours you should not:  A) Drive an automobile B) Make any legal decisions C) Drink any alcoholic beverage   2) You may resume regular meals tomorrow.  Today it is better to  start with liquids and gradually work up to solid foods.  You may eat anything you prefer, but it is better to start with liquids, then soup and crackers, and gradually work up to solid foods.   3) Please notify your doctor immediately if you have any unusual bleeding, trouble breathing, redness and pain at the surgery site, drainage, fever, or pain not relieved by medication.    4) Additional Instructions:  May take dressing off tommorow and shower       Please  contact your physician with any problems or Same Day Surgery at [phone], Monday through Friday 6 am to 4 pm, or [place] at [facility] number at [phone].

## 2019-04-23 NOTE — Interval H&P Note (Signed)
History and Physical Interval Note:  04/23/2019 12:45 PM  Jacob Byrd  has presented today for surgery, with the diagnosis of bilateral spermatoceles.  The various methods of treatment have been discussed with the patient and family. After consideration of risks, benefits and other options for treatment, the patient has consented to  Procedure(s): EPIDIDYMECTOMY (Bilateral) as a surgical intervention.  The patient's history has been reviewed, patient examined, no change in status, stable for surgery.  I have reviewed the patient's chart and labs.  Questions were answered to the patient's satisfaction.    RRR CTAB  Hollice Espy

## 2019-04-23 NOTE — Anesthesia Procedure Notes (Signed)
Procedure Name: LMA Insertion Date/Time: 04/23/2019 1:17 PM Performed by: Nelda Marseille, CRNA Pre-anesthesia Checklist: Patient identified, Patient being monitored, Timeout performed, Emergency Drugs available and Suction available Patient Re-evaluated:Patient Re-evaluated prior to induction Oxygen Delivery Method: Circle system utilized Preoxygenation: Pre-oxygenation with 100% oxygen Induction Type: IV induction Ventilation: Mask ventilation without difficulty LMA: LMA inserted LMA Size: 4.5 Tube type: Oral Number of attempts: 1 Placement Confirmation: positive ETCO2 and breath sounds checked- equal and bilateral Tube secured with: Tape Dental Injury: Teeth and Oropharynx as per pre-operative assessment

## 2019-04-24 ENCOUNTER — Encounter: Payer: Self-pay | Admitting: Urology

## 2019-04-24 NOTE — Anesthesia Postprocedure Evaluation (Signed)
Anesthesia Post Note  Patient: Jacob Byrd  Procedure(s) Performed: EPIDIDYMECTOMY (Bilateral )  Patient location during evaluation: PACU Anesthesia Type: General Level of consciousness: awake and alert Pain management: pain level controlled Vital Signs Assessment: post-procedure vital signs reviewed and stable Respiratory status: spontaneous breathing, nonlabored ventilation, respiratory function stable and patient connected to nasal cannula oxygen Cardiovascular status: blood pressure returned to baseline and stable Postop Assessment: no apparent nausea or vomiting Anesthetic complications: no     Last Vitals:  Vitals:   04/23/19 1521 04/23/19 1528  BP: (!) 142/80 (!) 144/81  Pulse: 76 73  Resp: 14 20  Temp: (!) 36.3 C (!) 36.4 C  SpO2: 99% 98%    Last Pain:  Vitals:   04/23/19 1528  TempSrc: Temporal  PainSc: 0-No pain                 Precious Haws Buster Schueller

## 2019-04-25 ENCOUNTER — Telehealth: Payer: Self-pay | Admitting: Radiology

## 2019-04-25 NOTE — Telephone Encounter (Signed)
Patient notified he can discontinue the jockstrap after the swelling a bruising starts to go away. I informed him a week or 2. After that he will need to wear tight under ware for the support for awhile. Patient voiced understanding.

## 2019-04-25 NOTE — Telephone Encounter (Signed)
Patient would like to know how long he needs to wear male support device following bilateral epididymectomy on 04/23/2019. Please return patient's call to [phone].

## 2019-04-26 LAB — SURGICAL PATHOLOGY

## 2019-04-30 ENCOUNTER — Telehealth: Payer: Self-pay | Admitting: Radiology

## 2019-04-30 NOTE — Telephone Encounter (Signed)
Patient informed verbalized understanding

## 2019-04-30 NOTE — Telephone Encounter (Signed)
Don't use anything on the wound.  Wait until all swelling and bruising is gone for BOTH!  Hollice Espy, MD

## 2019-04-30 NOTE — Telephone Encounter (Signed)
Patient would like to know when he can go bowling and when can he have sex again after bilateral epididymectomy on 04/23/2019. He reports that he has continued to wear the jock strap and that bruising and discomfort have improved. He reports using gauze for mild drainage noticed on the jock strap. He states he has been using lotion on the incision area. Recommended he use neosporin instead. Please return patient's call at [phone].

## 2019-05-14 NOTE — Progress Notes (Signed)
05/15/2019 11:08 AM   Jacob Byrd 1955/11/19 [id number]  Referring provider: Kandyce RudBabaoff, Marcus, MD [phone] [address] [facility] [place] [facility] [place],  [place] [id number]  Chief Complaint  Patient presents with   Post-op Follow-up    HPI: Jacob Byrd is a 63 year old male who is status post bilateral epididymectomy and bilateral spermatocelectomy on 04/23/2019 with Dr. Apolinar JunesBrandon who presents today for follow up.    He states that he had the usual post operative pain, swelling and mild drainage.  He states that he is pretty much back to normal.  He is not having any further pain or swelling.  Patient denies any gross hematuria, dysuria or suprapubic/flank pain.  Patient denies any fevers, chills, nausea or vomiting.   He is having no issues with urination or with erections.    PSA's by PCP have been normal.     PMH: Past Medical History:  Diagnosis Date   GERD (gastroesophageal reflux disease)    History of stomach ulcers    Medical history non-contributory     Surgical History: Past Surgical History:  Procedure Laterality Date   CATARACT EXTRACTION W/PHACO Left 08/11/2017   Procedure: CATARACT EXTRACTION PHACO AND INTRAOCULAR LENS PLACEMENT (IOC);  Surgeon: Jacob Byrd, Jacob Mark, MD;  Location: [facility] ORS;  Service: Ophthalmology;  Laterality: Left;  Lot #[id number] H US: 00:31.4 AP%: 15.5 CDE: 4.86   CATARACT EXTRACTION W/PHACO Right 10/19/2017   Procedure: CATARACT EXTRACTION PHACO AND INTRAOCULAR LENS PLACEMENT (IOC);  Surgeon: Jacob Byrd, Jacob Mark, MD;  Location: [facility] OR[facility];  Service: Ophthalmology;  Laterality: Right;  US 00:23.8 AP% 9.2 CDE 2.18 Fluid Pack Lot # [id number]   COLONOSCOPY     EPIDIDYMECTOMY Bilateral 04/23/2019   Procedure: EPIDIDYMECTOMY;  Surgeon: Vanna ScotlandBrandon, Ashley, MD;  Location: [facility] ORS;  Service: Urology;  Laterality: Bilateral;    Home Medications:  Allergies as of 05/15/2019      Reactions   Penicillins Other (See Comments)   Did it involve swelling of the face/tongue/throat, SOB, or low BP? Unknown Did it involve sudden or severe rash/hives, skin peeling, or any reaction on the inside of your mouth or nose? Unknown Did you need to seek medical attention at a hospital or doctor's office? Unknown When did it last happen? Childhood reaction If all above answers are NO, may proceed with cephalosporin use.   Seasonal Ic [cholestatin] Other (See Comments), Cough   Seasonal allergies with runny nose, etc      Medication List       Accurate as of May 15, 2019 11:08 AM. If you have any questions, ask your nurse or doctor.        cetirizine 10 MG tablet Commonly known as: ZYRTEC Take 10 mg by mouth daily.   docusate sodium 100 MG capsule Commonly known as: COLACE Take 1 capsule (100 mg total) by mouth 2 (two) times daily.   HYDROcodone-acetaminophen 5-325 MG tablet Commonly known as: NORCO/VICODIN Take 1-2 tablets by mouth every 6 (six) hours as needed for moderate pain.   multivitamin with minerals Tabs tablet Take 1 tablet by mouth daily.   omeprazole 20 MG capsule Commonly known as: PRILOSEC Take 20 mg by mouth daily before breakfast.   vitamin C 1000 MG tablet Take 1,000 mg by mouth daily.   VITAMIN D3 PO Take 1 tablet by mouth daily.       Allergies:  Allergies  Allergen Reactions   Penicillins Other (See Comments)    Did it involve swelling  of the face/tongue/throat, SOB, or low BP? Unknown Did it involve sudden or severe rash/hives, skin peeling, or any reaction on the inside of your mouth or nose? Unknown Did you need to seek medical attention at a hospital or doctor's office? Unknown When did it last happen? Childhood reaction If all above answers are NO, may proceed with cephalosporin use.    Seasonal Ic [Cholestatin] Other (See Comments) and Cough    Seasonal allergies with runny nose, etc    Family History: No family history on file.  Social History:  reports  that he has been smoking cigars. He has never used smokeless tobacco. He reports that he does not drink alcohol or use drugs.  ROS: UROLOGY Frequent Urination?: No Hard to postpone urination?: No Burning/pain with urination?: No Get up at night to urinate?: No Leakage of urine?: No Urine stream starts and stops?: No Trouble starting stream?: No Do you have to strain to urinate?: No Blood in urine?: No Urinary tract infection?: No Sexually transmitted disease?: No Injury to kidneys or bladder?: No Painful intercourse?: No Weak stream?: No Erection problems?: No Penile pain?: No  Gastrointestinal Nausea?: No Vomiting?: No Indigestion/heartburn?: No Diarrhea?: No Constipation?: No  Constitutional Fever: No Night sweats?: No Weight loss?: No Fatigue?: No  Skin Skin rash/lesions?: No Itching?: No  Eyes Blurred vision?: No Double vision?: No  Ears/Nose/Throat Sore throat?: No Sinus problems?: No  Hematologic/Lymphatic Swollen glands?: No Easy bruising?: No  Cardiovascular Leg swelling?: No Chest pain?: No  Respiratory Cough?: No Shortness of breath?: No  Endocrine Excessive thirst?: No  Musculoskeletal Back pain?: No Joint pain?: No  Neurological Headaches?: No Dizziness?: No  Psychologic Depression?: No Anxiety?: No  Physical Exam: BP 111/69    Pulse 80    Ht 5\' 10"  (1.778 m)    Wt 199 lb (90.3 kg)    BMI 28.55 kg/m   Constitutional:  Well nourished. Alert and oriented, No acute distress. HEENT: [place] AT, moist mucus membranes.  Trachea midline, no masses. Cardiovascular: No clubbing, cyanosis, or edema. Respiratory: Normal respiratory effort, no increased work of breathing. GU: No CVA tenderness.  No bladder fullness or masses.  Patient with circumcised phallus.  Urethral meatus is patent.  No penile discharge. No penile lesions or rashes. Scrotum without lesions, cysts, rashes and/or edema.  Testicles are located scrotally bilaterally.  No  masses are appreciated in the testicles. Left and right epididymis are not painful, but some mild induration noted.  No fluctuance, crepitus or erythema noted.  Skin: No rashes, bruises or suspicious lesions. Neurologic: Grossly intact, no focal deficits, moving all 4 extremities. Psychiatric: Normal mood and affect.  Laboratory Data: Lab Results  Component Value Date   WBC 11.1 (H) 04/16/2016   HGB 12.3 (L) 04/16/2016   HCT 34.8 (L) 04/16/2016   MCV 92.9 04/16/2016   PLT 392 04/16/2016    Lab Results  Component Value Date   CREATININE 0.90 04/16/2016    No results found for: PSA  No results found for: TESTOSTERONE  No results found for: HGBA1C  No results found for: TSH  No results found for: CHOL, HDL, CHOLHDL, VLDL, LDLCALC  Lab Results  Component Value Date   AST 24 04/16/2016   Lab Results  Component Value Date   ALT 26 04/16/2016   No components found for: ALKALINEPHOPHATASE No components found for: BILIRUBINTOTAL  No results found for: ESTRADIOL  Urinalysis    Component Value Date/Time   APPEARANCEUR Clear 03/28/2019 0853   GLUCOSEU  Negative 03/28/2019 0853   BILIRUBINUR Negative 03/28/2019 0853   PROTEINUR Negative 03/28/2019 0853   NITRITE Negative 03/28/2019 0853   LEUKOCYTESUR Negative 03/28/2019 0853    I have reviewed the labs.   Pertinent Imaging: N/A I have independently reviewed the films.    Assessment & Plan:   1. Bilateral spermatoceles S/P Bilateral epididymectomy/spermatocelectomy in 04/2019 Doing well - follow up as needed    Return if symptoms worsen or fail to improve.  These notes generated with voice recognition software. I apologize for typographical errors.  Zara Council, PA-C  [facility] [address]  [place], [place] [phone]

## 2019-05-15 ENCOUNTER — Encounter: Payer: Self-pay | Admitting: Urology

## 2019-05-15 ENCOUNTER — Other Ambulatory Visit: Payer: Self-pay

## 2019-05-15 ENCOUNTER — Ambulatory Visit (INDEPENDENT_AMBULATORY_CARE_PROVIDER_SITE_OTHER): Payer: 59 | Admitting: Urology

## 2019-05-15 VITALS — BP 111/69 | HR 80 | Ht 70.0 in | Wt 199.0 lb

## 2019-05-15 DIAGNOSIS — N4342 Spermatocele of epididymis, multiple: Secondary | ICD-10-CM

## 2019-12-26 ENCOUNTER — Other Ambulatory Visit: Payer: Self-pay | Admitting: Otolaryngology

## 2019-12-26 DIAGNOSIS — H9042 Sensorineural hearing loss, unilateral, left ear, with unrestricted hearing on the contralateral side: Secondary | ICD-10-CM

## 2020-01-10 ENCOUNTER — Ambulatory Visit: Payer: PRIVATE HEALTH INSURANCE

## 2020-01-22 ENCOUNTER — Other Ambulatory Visit: Payer: Self-pay

## 2020-01-22 ENCOUNTER — Ambulatory Visit
Admission: RE | Admit: 2020-01-22 | Discharge: 2020-01-22 | Disposition: A | Discharge status: Home / Self Care | Payer: PRIVATE HEALTH INSURANCE | Source: Ambulatory Visit | Attending: Otolaryngology | Admitting: Otolaryngology

## 2020-01-22 DIAGNOSIS — H9042 Sensorineural hearing loss, unilateral, left ear, with unrestricted hearing on the contralateral side: Secondary | ICD-10-CM | POA: Diagnosis present

## 2020-01-22 MED ORDER — GADOBUTROL 1 MMOL/ML IV SOLN
9.0000 mL | Freq: Once | INTRAVENOUS | Status: AC | PRN
  Administered 2020-01-22: 9 mL via INTRAVENOUS

## 2021-08-18 ENCOUNTER — Emergency Department
Admission: EM | Admit: 2021-08-18 | Discharge: 2021-08-18 | Disposition: A | Discharge status: Home / Self Care | Payer: PRIVATE HEALTH INSURANCE | Attending: Student in an Organized Health Care Education/Training Program | Admitting: Student in an Organized Health Care Education/Training Program

## 2021-08-18 ENCOUNTER — Emergency Department: Payer: PRIVATE HEALTH INSURANCE

## 2021-08-18 ENCOUNTER — Other Ambulatory Visit: Payer: Self-pay

## 2021-08-18 DIAGNOSIS — Z20822 Contact with and (suspected) exposure to covid-19: Secondary | ICD-10-CM | POA: Diagnosis not present

## 2021-08-18 DIAGNOSIS — R61 Generalized hyperhidrosis: Secondary | ICD-10-CM | POA: Insufficient documentation

## 2021-08-18 DIAGNOSIS — R42 Dizziness and giddiness: Secondary | ICD-10-CM | POA: Diagnosis present

## 2021-08-18 DIAGNOSIS — R55 Syncope and collapse: Secondary | ICD-10-CM | POA: Insufficient documentation

## 2021-08-18 LAB — URINALYSIS, ROUTINE W REFLEX MICROSCOPIC
Bacteria, UA: NONE SEEN
Bilirubin Urine: NEGATIVE
Glucose, UA: NEGATIVE mg/dL
Hgb urine dipstick: NEGATIVE
Ketones, ur: NEGATIVE mg/dL
Leukocytes,Ua: NEGATIVE
Nitrite: NEGATIVE
Protein, ur: 100 mg/dL — AB
Specific Gravity, Urine: 1.011 (ref 1.005–1.030)
pH: 6 (ref 5.0–8.0)

## 2021-08-18 LAB — CBC WITH DIFFERENTIAL/PLATELET
Abs Immature Granulocytes: 0.09 10*3/uL — ABNORMAL HIGH (ref 0.00–0.07)
Basophils Absolute: 0.1 10*3/uL (ref 0.0–0.1)
Basophils Relative: 0 %
Eosinophils Absolute: 0.1 10*3/uL (ref 0.0–0.5)
Eosinophils Relative: 1 %
HCT: 33.8 % — ABNORMAL LOW (ref 39.0–52.0)
Hemoglobin: 11.6 g/dL — ABNORMAL LOW (ref 13.0–17.0)
Immature Granulocytes: 1 %
Lymphocytes Relative: 6 %
Lymphs Abs: 1.2 10*3/uL (ref 0.7–4.0)
MCH: 32 pg (ref 26.0–34.0)
MCHC: 34.3 g/dL (ref 30.0–36.0)
MCV: 93.1 fL (ref 80.0–100.0)
Monocytes Absolute: 1.7 10*3/uL — ABNORMAL HIGH (ref 0.1–1.0)
Monocytes Relative: 10 %
Neutro Abs: 15 10*3/uL — ABNORMAL HIGH (ref 1.7–7.7)
Neutrophils Relative %: 82 %
Platelets: 444 10*3/uL — ABNORMAL HIGH (ref 150–400)
RBC: 3.63 MIL/uL — ABNORMAL LOW (ref 4.22–5.81)
RDW: 12.8 % (ref 11.5–15.5)
WBC: 18.2 10*3/uL — ABNORMAL HIGH (ref 4.0–10.5)
nRBC: 0 % (ref 0.0–0.2)

## 2021-08-18 LAB — RESP PANEL BY RT-PCR (FLU A&B, COVID) ARPGX2
Influenza A by PCR: NEGATIVE
Influenza B by PCR: NEGATIVE
SARS Coronavirus 2 by RT PCR: NEGATIVE

## 2021-08-18 LAB — COMPREHENSIVE METABOLIC PANEL
ALT: 33 U/L (ref 0–44)
AST: 30 U/L (ref 15–41)
Albumin: 4 g/dL (ref 3.5–5.0)
Alkaline Phosphatase: 60 U/L (ref 38–126)
Anion gap: 7 (ref 5–15)
BUN: 13 mg/dL (ref 8–23)
CO2: 24 mmol/L (ref 22–32)
Calcium: 8.6 mg/dL — ABNORMAL LOW (ref 8.9–10.3)
Chloride: 100 mmol/L (ref 98–111)
Creatinine, Ser: 1.21 mg/dL (ref 0.61–1.24)
GFR, Estimated: >60 mL/min (ref >60)
Glucose, Bld: 101 mg/dL — ABNORMAL HIGH (ref 70–99)
Potassium: 4.6 mmol/L (ref 3.5–5.1)
Sodium: 131 mmol/L — ABNORMAL LOW (ref 135–145)
Total Bilirubin: 0.5 mg/dL (ref 0.3–1.2)
Total Protein: 7.1 g/dL (ref 6.5–8.1)

## 2021-08-18 LAB — PROCALCITONIN: Procalcitonin: 0.11 ng/mL

## 2021-08-18 LAB — TROPONIN I (HIGH SENSITIVITY): Troponin I (High Sensitivity): <2 ng/L (ref <18)

## 2021-08-18 LAB — LACTIC ACID, PLASMA: Lactic Acid, Venous: 0.7 mmol/L (ref 0.5–1.9)

## 2021-08-18 MED ORDER — SODIUM CHLORIDE 0.9 % IV BOLUS
1000.0000 mL | Freq: Once | INTRAVENOUS | Status: AC
  Administered 2021-08-18: 1000 mL via INTRAVENOUS

## 2021-08-18 MED ORDER — MECLIZINE HCL 25 MG PO TABS: 25.0000 mg | ORAL_TABLET | Freq: Three times a day (TID) | ORAL | 0 refills | Status: AC | PRN

## 2021-08-18 NOTE — ED Notes (Signed)
Pt reports dizziness after having a BM. Pt reports some nausea, denies vomiting. No LOC. No cardiac hx. No changes in meds (only med he takes is omeprazole).

## 2021-08-18 NOTE — ED Triage Notes (Signed)
Pt BIB EMS from work. Pt reports feeling normal today until just pta. Pt had a BM at work & felt dizzy after. VS for EMS:  BP 95-61 SpO2 98% on RA HR 71

## 2021-08-18 NOTE — ED Provider Notes (Signed)
[facility] Provider Note  Patient Contact: 3:51 PM (approximate)   History   Dizziness   HPI  Jacob Byrd is a 66 y.o. male who presents the emergency department complaining of lightheadedness/dizziness/sweating.  Patient states that he was having a bowel movement, stood up felt lightheaded, dizzy and near syncopal.  Patient states that he broke out into a cold sweat.  Symptoms resolved but he wanted to be evaluated.  No headache, visual changes.  No actual syncope.  Patient denies chest pain, shortness of breath, GI symptoms.  Patient states up to this event he had felt normal.  He feels like he is roughly back at his baseline at this time.  Patient states that he is largely healthy, last healthcare encounter was roughly 7 months ago when he had his routine physical.  This was visualized in epic.  Reassessment of the patient after initial labs have returned patient denies any fevers, chills, nasal congestion, sore throat, ear pain, cough, shortness of breath, GI complaints.  Patient states that he is now still dizzy, is having some tingling in his left upper extremity.  No history of CVA.  No visual changes.  No weakness described with this numbness/tingling.      Physical Exam   Triage Vital Signs: ED Triage Vitals  Enc Vitals Group     BP 08/18/21 1546 108/68     Pulse Rate 08/18/21 1546 77     Resp 08/18/21 1546 16     Temp --      Temp src --      SpO2 08/18/21 1540 98 %     Weight 08/18/21 1542 200 lb (90.7 kg)     Height 08/18/21 1542 5\' 10"  (1.778 m)     Head Circumference --      Peak Flow --      Pain Score 08/18/21 1542 0     Pain Loc --      Pain Edu? --      Excl. in GC? --     Most recent vital signs: Vitals:   08/18/21 1546 08/18/21 1630  BP: 108/68 106/69  Pulse: 77 72  Resp: 16 16  SpO2: 98% 98%     General: Alert and in no acute distress. Eyes:  PERRL. EOMI.   Cardiovascular:  Good peripheral perfusion Respiratory:  Normal respiratory effort without tachypnea or retractions. Lungs CTAB. Good air entry to the bases with no decreased or absent breath sounds. Gastrointestinal: Bowel sounds 4 quadrants. Soft and nontender to palpation. No guarding or rigidity. No palpable masses. No distention. No CVA tenderness. Musculoskeletal: Full range of motion to all extremities.  Neurologic:  No gross focal neurologic deficits are appreciated.  Cranial nerves II through XII grossly intact Skin:   No rash noted Other:   ED Results / Procedures / Treatments   Labs (all labs ordered are listed, but only abnormal results are displayed) Labs Reviewed  COMPREHENSIVE METABOLIC PANEL - Abnormal; Notable for the following components:      Result Value   Sodium 131 (*)    Glucose, Bld 101 (*)    Calcium 8.6 (*)    All other components within normal limits  CBC WITH DIFFERENTIAL/PLATELET - Abnormal; Notable for the following components:   WBC 18.2 (*)    RBC 3.63 (*)    Hemoglobin 11.6 (*)    HCT 33.8 (*)    Platelets 444 (*)    Neutro Abs 15.0 (*)    Monocytes  Absolute 1.7 (*)    Abs Immature Granulocytes 0.09 (*)    All other components within normal limits  URINALYSIS, ROUTINE W REFLEX MICROSCOPIC - Abnormal; Notable for the following components:   Color, Urine YELLOW (*)    APPearance HAZY (*)    Protein, ur 100 (*)    All other components within normal limits  RESP PANEL BY RT-PCR (FLU A&B, COVID) ARPGX2  LACTIC ACID, PLASMA  PROCALCITONIN  TROPONIN I (HIGH SENSITIVITY)  TROPONIN I (HIGH SENSITIVITY)     EKG  ED ECG REPORT I, Delorise Royals Sircharles Holzheimer,  personally viewed and interpreted this ECG.   Date: 08/18/2021  EKG Time: 1544 hrs.  Rate: 77 bpm  Rhythm: unchanged from previous tracings, normal sinus rhythm  Axis: Leftward axis  Intervals:none  ST&T Change: No ST elevation or depression noted.  Normal sinus rhythm.  No significant changes on EKG from previous EKG from 04/16/2019.  No  STEMI    RADIOLOGY  I personally viewed and evaluated these images as part of my medical decision making, as well as reviewing the written report by the radiologist.  ED Provider Interpretation: Evaluation chest x-ray revealed no acute cardiopulmonary abnormality.  CT scan of the head reveals no acute intracranial abnormality  DG Chest 2 View  Result Date: 08/18/2021 CLINICAL DATA:  Dizziness, near syncope EXAM: CHEST - 2 VIEW COMPARISON:  None. FINDINGS: Frontal and lateral views of the chest demonstrate an unremarkable cardiac silhouette. No acute airspace disease, effusion, or pneumothorax. Prior healed left rib fractures. No acute bony abnormalities. IMPRESSION: 1. No acute intrathoracic process. Electronically Signed   By: Sharlet Salina M.D.   On: 08/18/2021 17:09   CT HEAD WO CONTRAST ( )  Result Date: 08/18/2021 CLINICAL DATA:  Dizziness.  Headache. EXAM: CT HEAD WITHOUT CONTRAST TECHNIQUE: Contiguous axial images were obtained from the base of the skull through the vertex without intravenous contrast. RADIATION DOSE REDUCTION: This exam was performed according to the departmental dose-optimization program which includes automated exposure control, adjustment of the mA and/or kV according to patient size and/or use of iterative reconstruction technique. COMPARISON:  Head MRI 01/22/2020 FINDINGS: Brain: There is no evidence of an acute infarct, intracranial hemorrhage, mass, midline shift, or extra-axial fluid collection. The ventricles and sulci are normal. Vascular: Calcified atherosclerosis at the skull base. No hyperdense vessel. Skull: No acute fracture or suspicious osseous lesion. Sinuses/Orbits: Minimal mucosal thickening in the paranasal sinuses. Clear mastoid air cells. Bilateral cataract extraction. Other: None. IMPRESSION: Negative head CT. Electronically Signed   By: Sebastian Ache M.D.   On: 08/18/2021 17:44    PROCEDURES:  Critical Care performed:  No  Procedures   MEDICATIONS ORDERED IN ED: Medications  sodium chloride 0.9 % bolus 1,000 mL (1,000 mLs Intravenous New Bag/Given 08/18/21 1758)     IMPRESSION / MDM / ASSESSMENT AND PLAN / ED COURSE  I reviewed the triage vital signs and the nursing notes.                              Differential diagnosis includes, but is not limited to, dizziness, CVA, vagal episode, dehydration, arrhythmia, STEMI  The patient is on the cardiac monitor to evaluate for evidence of arrhythmia and/or significant heart rate changes.   Patient's diagnosis is consistent with dizziness.  Patient presented to the emergency department after period of dizziness after he stood up after using the restroom.  Patient admitted feeling fine up until that  point, stood up and immediately had dizziness.  He did not pass out.  He had no visual changes.  No slurred speech.  Patient presented to the ED, is roughly asymptomatic, did have a little ongoing dizziness but states that at this time he is completely back to his baseline.  He has no headache, visual changes, URI symptoms, chest pain, shortness of breath, GI complaints, urinary complaints at this time.  Patient has had CT scan, chest x-ray, labs, EKG.  Patient was not anemic, kidney function is reassuring on the metabolic panel.  He did have an elevation of his white blood cell count to 18.2.  Urine is reassuring.  Lactic within normal limits.  Chest x-ray is reassuring.  CT scan of the head revealed no evidence of acute intracranial abnormality.  At this time patient is completely back to his normal.  He has been neurologically intact from the beginning though he did have some dizziness while here.  I do not find a source of infection for the patient and he has no bacterial/viral illness symptoms and x-ray, urinalysis is reassuring.  At this time I have discussed patient's results with him and his wife who is present at this time.  I offered MRI given the dizziness even  though it has resolved but the patient states that he would like to decline at this time.  Again he has been neurologically intact and there was no acute findings on CT.  CVA is not my leading differential at this time so I feel it is reasonable to decline the imaging. Patient is stable and at baseline at this time for discharge.  Patient has been given strict return precautions.  He may follow-up with his primary care as needed.  Return precautions for the emergency department are discussed at length with the patient.    Clinical Course as of 08/18/21 1953  Tue Aug 18, 2021  1720 Patient with high blood cell count of 18.2.  Patient denied any fever, URI type symptoms, cough, shortness of breath, urinary or GI complaints.  Patient states that he is now still dizzy, having a headache and left arm pain/numbness/tingling sensation.  This time patient will have CT scan performed, as well as additional labs.  Patient will also have COVID testing at this time.  Patient remains neurologically intact on physical exam. [JC]  1757 DG Chest 2 View [AE]  1757 Urinalysis, Routine w reflex microscopic [AE]    Clinical Course User Index [AE] Gladys Damme, Student-PA [JC] Ashkan Chamberland, Delorise Royals, PA-C     FINAL CLINICAL IMPRESSION(S) / ED DIAGNOSES   Final diagnoses:  Dizziness     Rx / DC Orders   ED Discharge Orders          Ordered    meclizine (ANTIVERT) 25 MG tablet  3 times daily PRN        08/18/21 1941             Note:  This document was prepared using Dragon voice recognition software and may include unintentional dictation errors.   Lanette Hampshire 08/18/21 1953    Willy Eddy, MD 08/18/21 2352

## 2023-11-21 IMAGING — CT CT HEAD W/O CM
4 series · 17 of 47 positions shown, 19 images · non-contrast
Comparison: Head MRI 01/22/2020

CLINICAL DATA: Dizziness.  Headache.



[Series 2: head wo · axial · 0.44mm/px · z∈[-365,-245]mm · 7 of 34 slices shown, 9 images]
[im 5/34  brain]
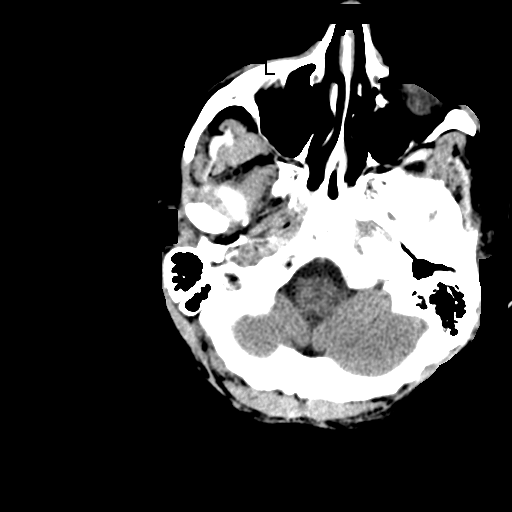
[im 5/34  bone]
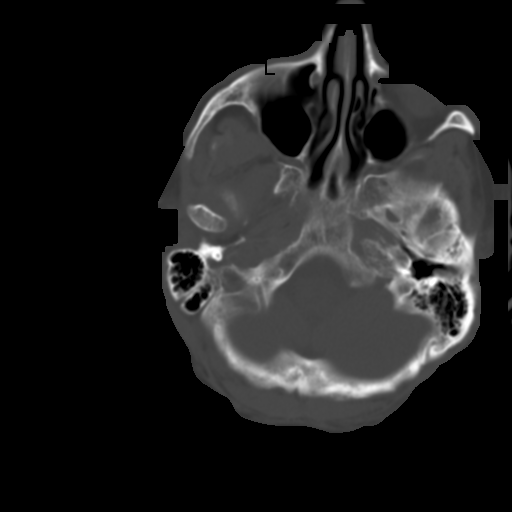
[im 9/34  brain]
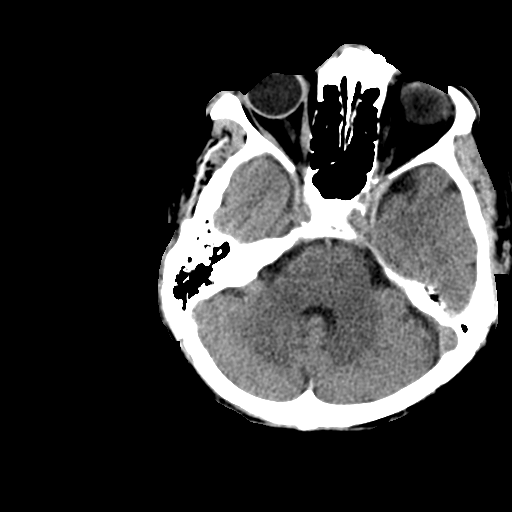
[im 13/34  brain]
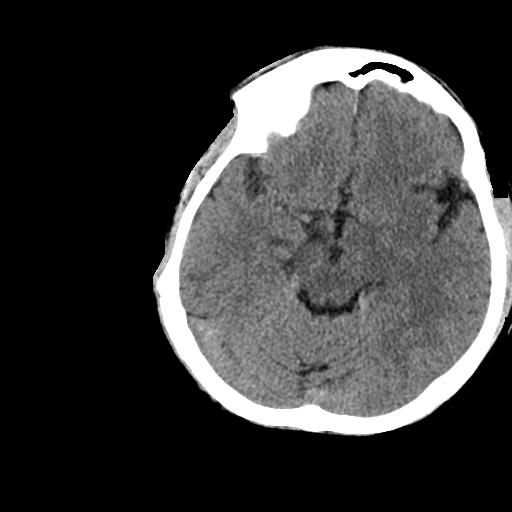
[im 17/34  brain]
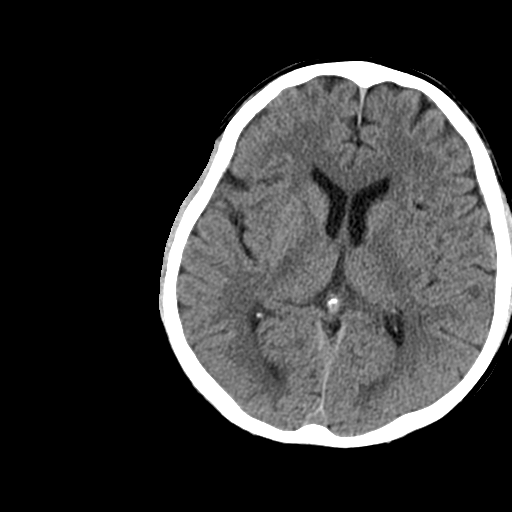
[im 21/34  brain]
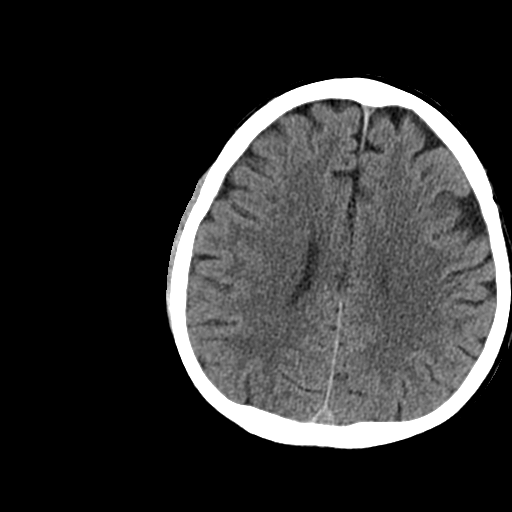
[im 21/34  bone]
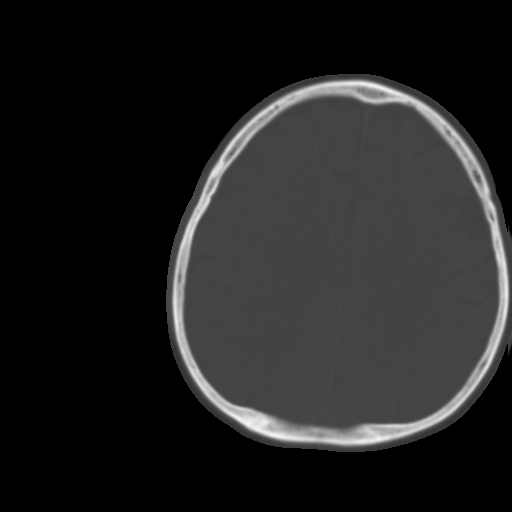
[im 25/34  brain]
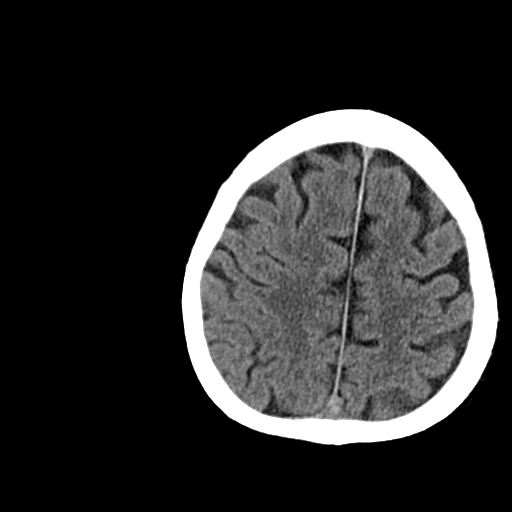
[im 29/34  brain]
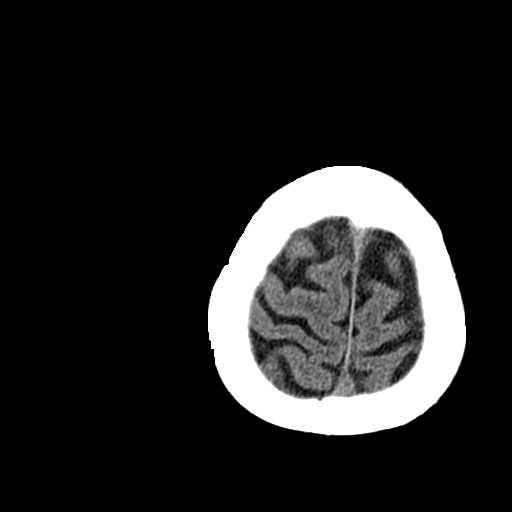

[Series 3: head bone · axial · 0.44mm/px · z∈[-369,-311]mm · 4 of 84 slices shown]
[im 9/84  bone]
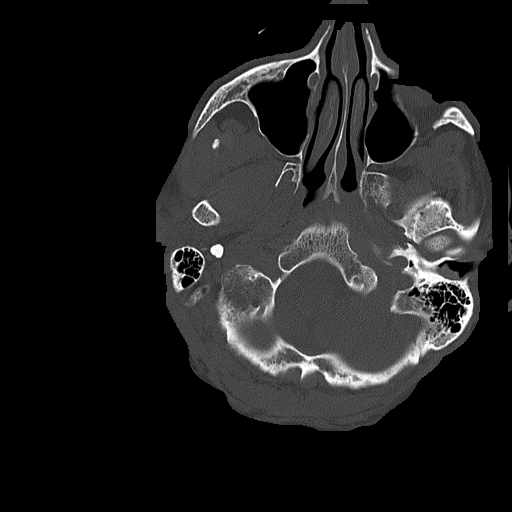
[im 17/84  bone]
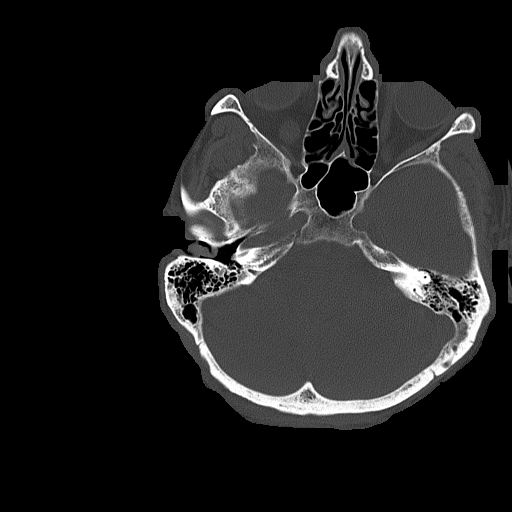
[im 25/84  bone]
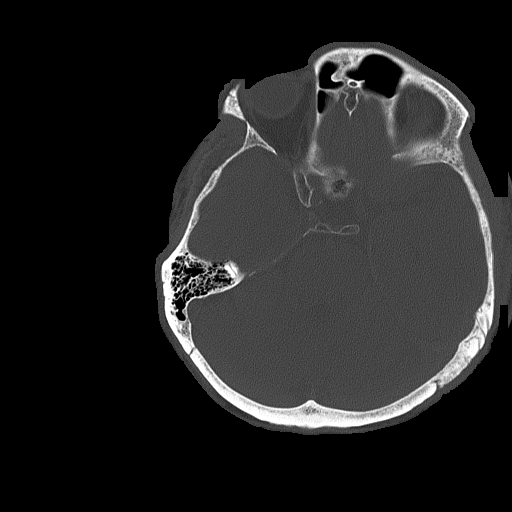
[im 38/84  bone]
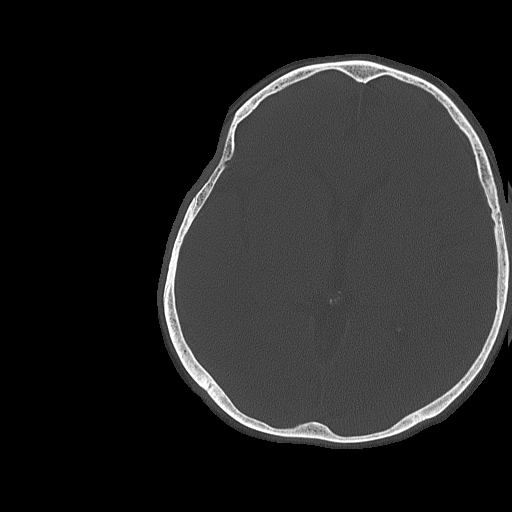

[Series 4: coronal soft tissue · coronal · 0.35mm/px · 3 of 71 slices shown]
[im 24/71  brain]
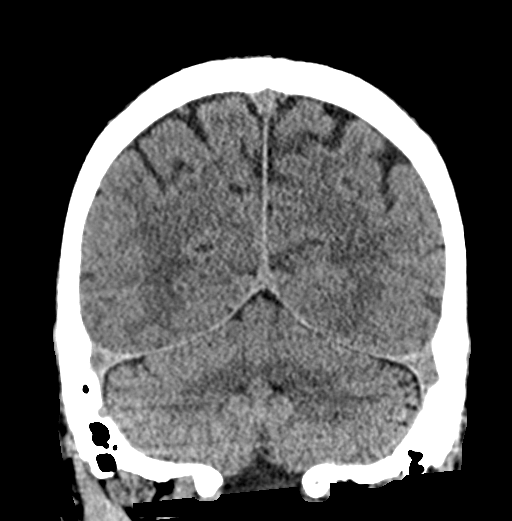
[im 32/71  brain]
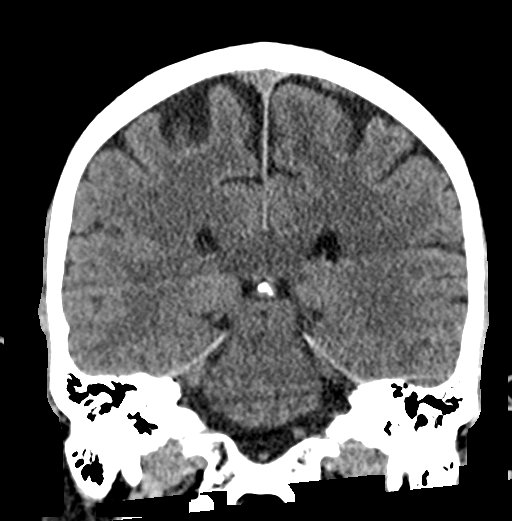
[im 39/71  brain]
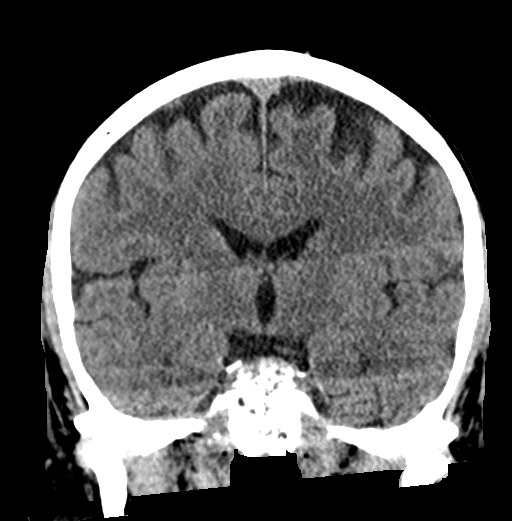

[Series 5: sagittal soft tissue · sagittal · 0.35mm/px · 3 of 60 slices shown]
[im 22/60  brain]
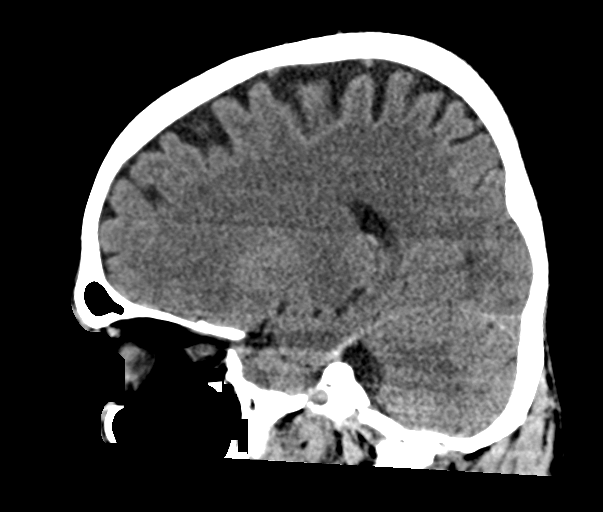
[im 30/60  brain]
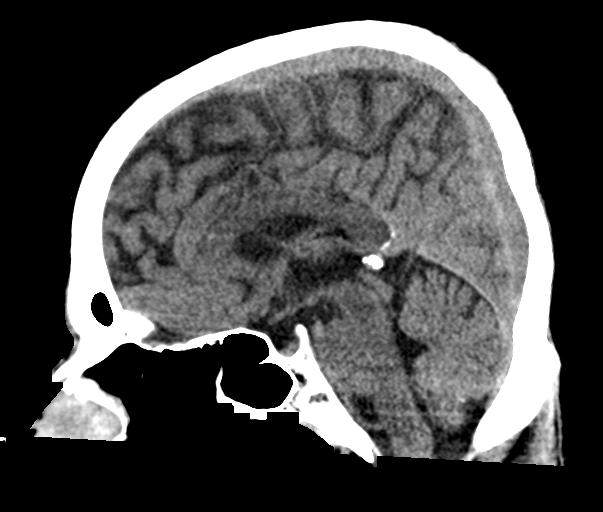
[im 39/60  brain]
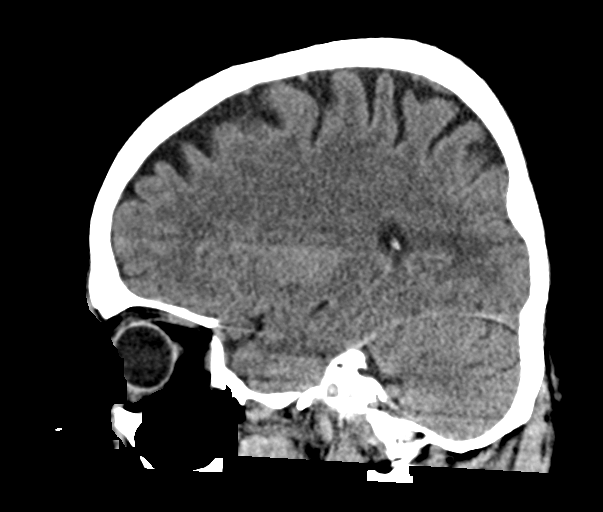

[17 of 47 positions shown; findings below may reference images not displayed]

FINDINGS: Brain: There is no evidence of an acute infarct, intracranial
hemorrhage, mass, midline shift, or extra-axial fluid collection.
The ventricles and sulci are normal.

Vascular: Calcified atherosclerosis at the skull base. No hyperdense
vessel.

Skull: No acute fracture or suspicious osseous lesion.

Sinuses/Orbits: Minimal mucosal thickening in the paranasal sinuses.
Clear mastoid air cells. Bilateral cataract extraction.

Other: None.
IMPRESSION: Negative head CT.

## 2023-11-21 IMAGING — CR DG CHEST 2V
1 series · 2 of 2 positions shown · non-contrast
Comparison: None.

CLINICAL DATA: Dizziness, near syncope

EXAM:
CHEST - 2 VIEW

[Series 1: dg chest 2 view · 0.14mm/px · 2 of 2 slices shown]
[im 1/2]
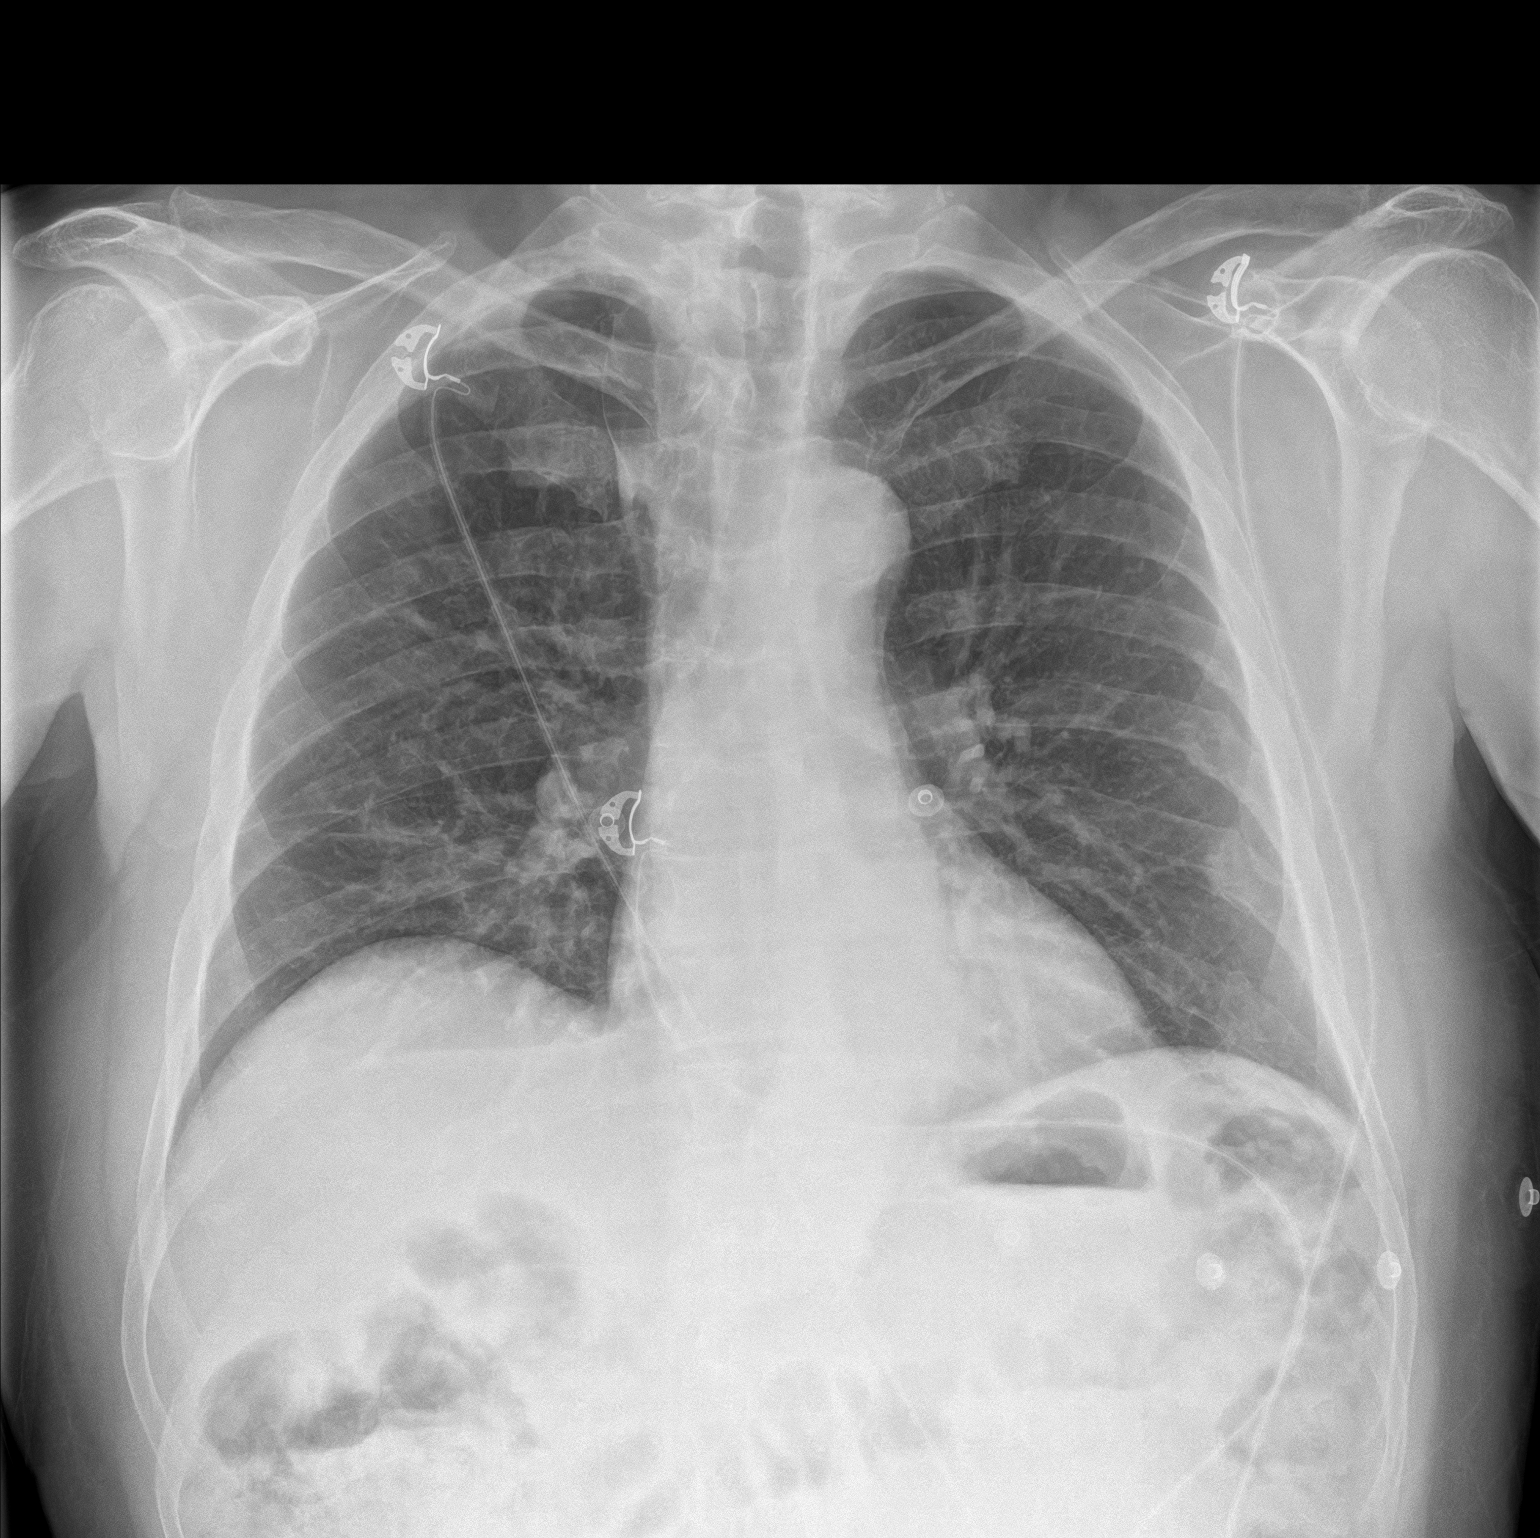
[im 2/2]
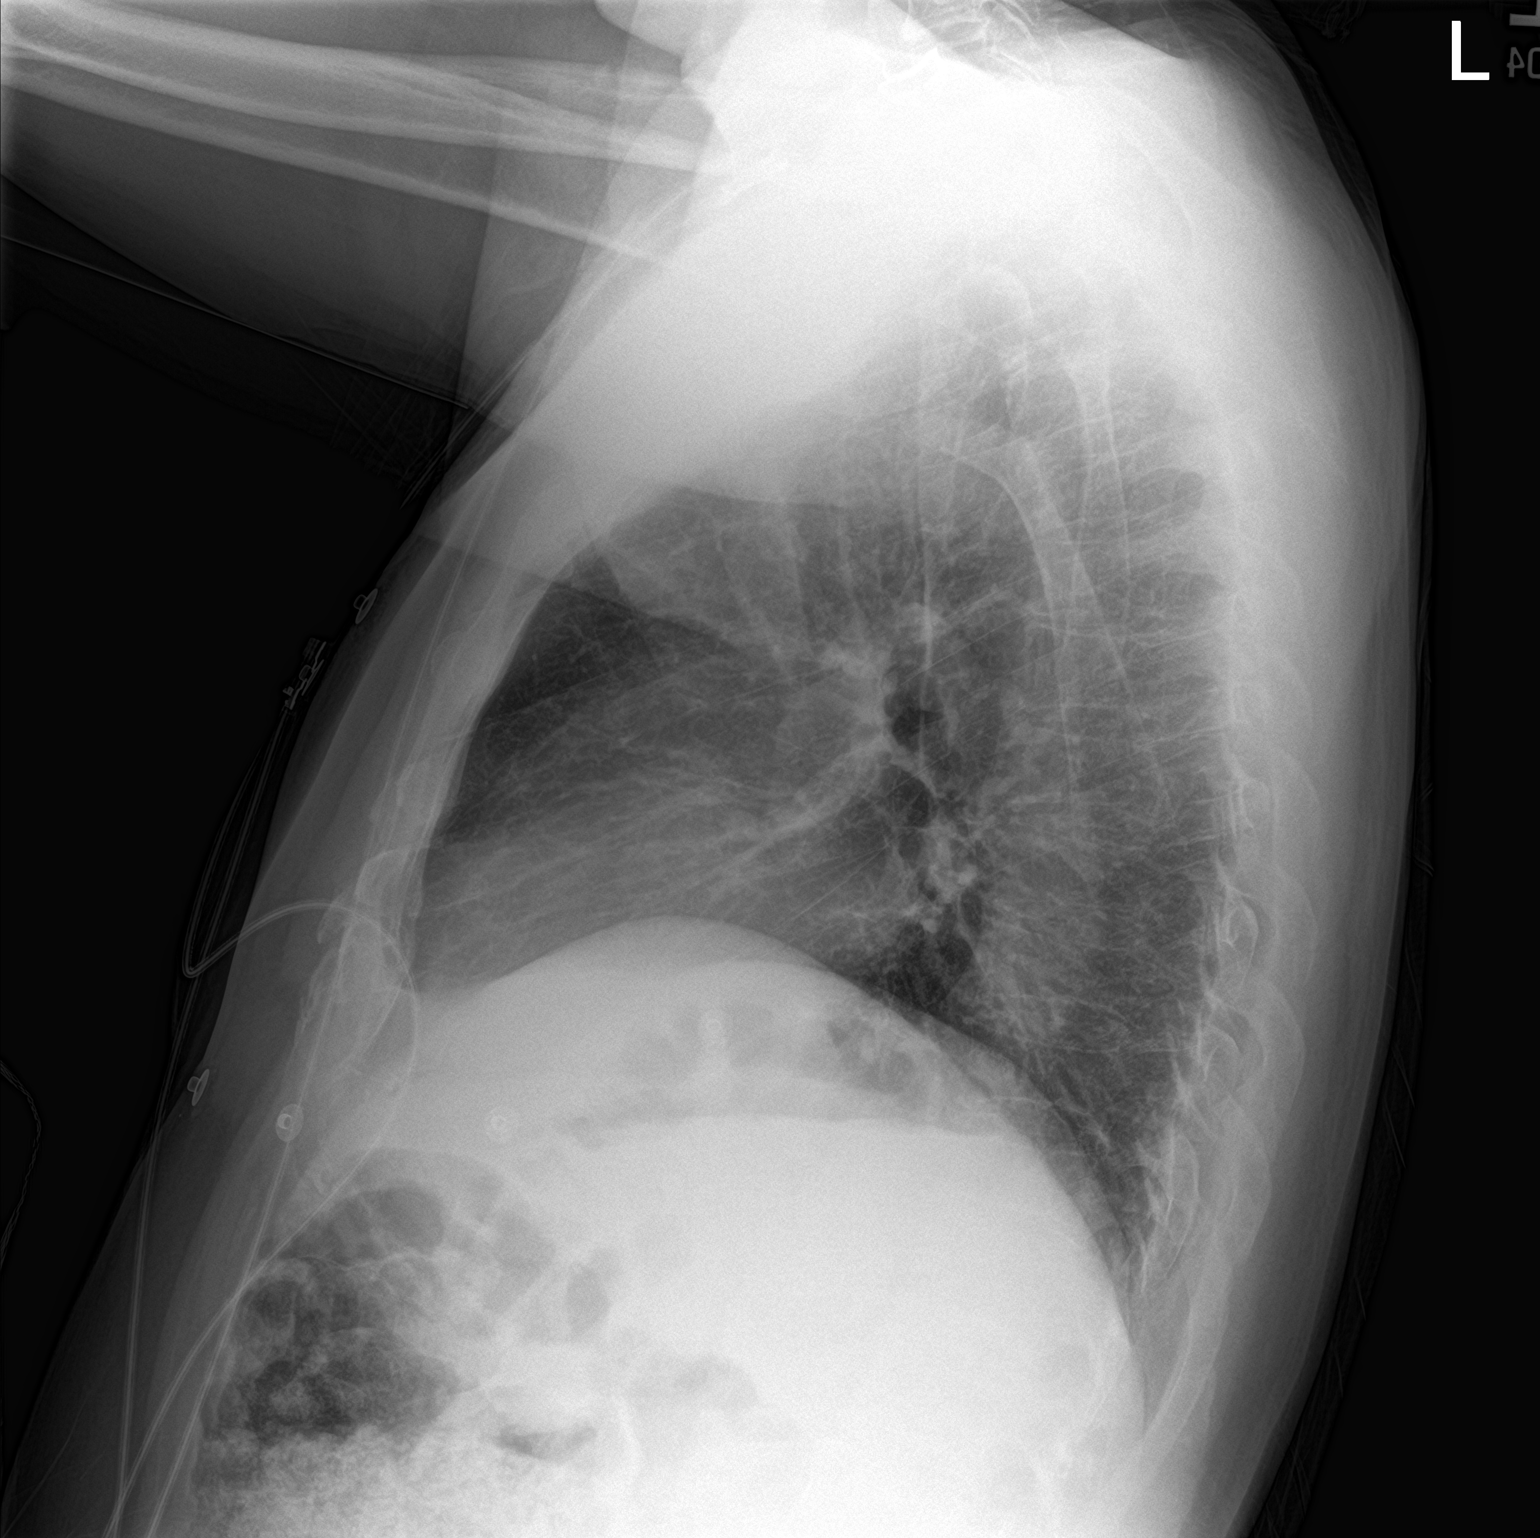

[2 of 2 positions shown; findings below may reference images not displayed]

FINDINGS: Frontal and lateral views of the chest demonstrate an unremarkable
cardiac silhouette. No acute airspace disease, effusion, or
pneumothorax. Prior healed left rib fractures. No acute bony
abnormalities.
IMPRESSION: 1. No acute intrathoracic process.

## 2024-04-13 ENCOUNTER — Ambulatory Visit

## 2024-04-13 DIAGNOSIS — D120 Benign neoplasm of cecum: Secondary | ICD-10-CM | POA: Diagnosis present

## 2024-04-13 DIAGNOSIS — D122 Benign neoplasm of ascending colon: Secondary | ICD-10-CM | POA: Diagnosis not present

## 2024-04-13 DIAGNOSIS — K640 First degree hemorrhoids: Secondary | ICD-10-CM | POA: Diagnosis not present

## 2024-04-13 DIAGNOSIS — D123 Benign neoplasm of transverse colon: Secondary | ICD-10-CM | POA: Diagnosis not present

## 2024-04-13 DIAGNOSIS — K900 Celiac disease: Secondary | ICD-10-CM | POA: Diagnosis not present

## 2024-04-13 DIAGNOSIS — K3189 Other diseases of stomach and duodenum: Secondary | ICD-10-CM | POA: Diagnosis not present
